# Patient Record
Sex: Female | Born: 1950 | Race: White | Hispanic: No | Smoking: Never smoker
Health system: Southern US, Community
[De-identification: ages and names within clinical notes are randomized; demographics above are authoritative.]

## PROBLEM LIST (undated history)

## (undated) DIAGNOSIS — Z22322 Carrier or suspected carrier of Methicillin resistant Staphylococcus aureus: Secondary | ICD-10-CM

## (undated) DIAGNOSIS — I350 Nonrheumatic aortic (valve) stenosis: Secondary | ICD-10-CM

## (undated) DIAGNOSIS — T63331A Toxic effect of venom of brown recluse spider, accidental (unintentional), initial encounter: Secondary | ICD-10-CM

## (undated) DIAGNOSIS — L97919 Non-pressure chronic ulcer of unspecified part of right lower leg with unspecified severity: Secondary | ICD-10-CM

## (undated) DIAGNOSIS — Z9109 Other allergy status, other than to drugs and biological substances: Secondary | ICD-10-CM

## (undated) DIAGNOSIS — L97929 Non-pressure chronic ulcer of unspecified part of left lower leg with unspecified severity: Secondary | ICD-10-CM

## (undated) DIAGNOSIS — R7303 Prediabetes: Secondary | ICD-10-CM

## (undated) DIAGNOSIS — I519 Heart disease, unspecified: Secondary | ICD-10-CM

## (undated) DIAGNOSIS — D649 Anemia, unspecified: Secondary | ICD-10-CM

## (undated) DIAGNOSIS — M1990 Unspecified osteoarthritis, unspecified site: Secondary | ICD-10-CM

## (undated) DIAGNOSIS — E669 Obesity, unspecified: Secondary | ICD-10-CM

## (undated) DIAGNOSIS — I2721 Secondary pulmonary arterial hypertension: Secondary | ICD-10-CM

---

## 2013-03-25 DIAGNOSIS — L97919 Non-pressure chronic ulcer of unspecified part of right lower leg with unspecified severity: Secondary | ICD-10-CM

## 2013-03-25 DIAGNOSIS — L97929 Non-pressure chronic ulcer of unspecified part of left lower leg with unspecified severity: Secondary | ICD-10-CM

## 2013-03-25 HISTORY — DX: Non-pressure chronic ulcer of unspecified part of right lower leg with unspecified severity: L97.919

## 2013-03-25 HISTORY — DX: Non-pressure chronic ulcer of unspecified part of left lower leg with unspecified severity: L97.929

## 2015-06-24 DIAGNOSIS — D649 Anemia, unspecified: Secondary | ICD-10-CM

## 2015-06-24 HISTORY — DX: Anemia, unspecified: D64.9

## 2015-07-11 ENCOUNTER — Emergency Department (HOSPITAL_COMMUNITY): Payer: Medicaid Other

## 2015-07-11 ENCOUNTER — Inpatient Hospital Stay (HOSPITAL_COMMUNITY)
Admission: EM | Admit: 2015-07-11 | Discharge: 2015-07-24 | DRG: 573 | Disposition: A | Discharge status: Skilled Nursing Facility | Payer: Medicaid Other | Attending: Internal Medicine | Admitting: Internal Medicine

## 2015-07-11 ENCOUNTER — Encounter (HOSPITAL_COMMUNITY): Payer: Self-pay | Admitting: *Deleted

## 2015-07-11 DIAGNOSIS — Z882 Allergy status to sulfonamides status: Secondary | ICD-10-CM | POA: Diagnosis not present

## 2015-07-11 DIAGNOSIS — I350 Nonrheumatic aortic (valve) stenosis: Secondary | ICD-10-CM | POA: Diagnosis present

## 2015-07-11 DIAGNOSIS — R011 Cardiac murmur, unspecified: Secondary | ICD-10-CM | POA: Diagnosis present

## 2015-07-11 DIAGNOSIS — L03116 Cellulitis of left lower limb: Secondary | ICD-10-CM | POA: Diagnosis present

## 2015-07-11 DIAGNOSIS — I272 Other secondary pulmonary hypertension: Secondary | ICD-10-CM | POA: Diagnosis present

## 2015-07-11 DIAGNOSIS — L89159 Pressure ulcer of sacral region, unspecified stage: Secondary | ICD-10-CM | POA: Diagnosis not present

## 2015-07-11 DIAGNOSIS — E46 Unspecified protein-calorie malnutrition: Secondary | ICD-10-CM | POA: Diagnosis present

## 2015-07-11 DIAGNOSIS — D473 Essential (hemorrhagic) thrombocythemia: Secondary | ICD-10-CM | POA: Diagnosis present

## 2015-07-11 DIAGNOSIS — R7303 Prediabetes: Secondary | ICD-10-CM | POA: Diagnosis present

## 2015-07-11 DIAGNOSIS — T39395A Adverse effect of other nonsteroidal anti-inflammatory drugs [NSAID], initial encounter: Secondary | ICD-10-CM | POA: Diagnosis present

## 2015-07-11 DIAGNOSIS — D638 Anemia in other chronic diseases classified elsewhere: Secondary | ICD-10-CM | POA: Diagnosis present

## 2015-07-11 DIAGNOSIS — Z22322 Carrier or suspected carrier of Methicillin resistant Staphylococcus aureus: Secondary | ICD-10-CM | POA: Diagnosis not present

## 2015-07-11 DIAGNOSIS — R739 Hyperglycemia, unspecified: Secondary | ICD-10-CM | POA: Diagnosis present

## 2015-07-11 DIAGNOSIS — D619 Aplastic anemia, unspecified: Secondary | ICD-10-CM | POA: Diagnosis present

## 2015-07-11 DIAGNOSIS — Z6841 Body Mass Index (BMI) 40.0 and over, adult: Secondary | ICD-10-CM

## 2015-07-11 DIAGNOSIS — I872 Venous insufficiency (chronic) (peripheral): Secondary | ICD-10-CM | POA: Diagnosis present

## 2015-07-11 DIAGNOSIS — K922 Gastrointestinal hemorrhage, unspecified: Secondary | ICD-10-CM | POA: Diagnosis present

## 2015-07-11 DIAGNOSIS — E43 Unspecified severe protein-calorie malnutrition: Secondary | ICD-10-CM | POA: Diagnosis present

## 2015-07-11 DIAGNOSIS — I2721 Secondary pulmonary arterial hypertension: Secondary | ICD-10-CM | POA: Diagnosis present

## 2015-07-11 DIAGNOSIS — L97909 Non-pressure chronic ulcer of unspecified part of unspecified lower leg with unspecified severity: Secondary | ICD-10-CM | POA: Diagnosis present

## 2015-07-11 DIAGNOSIS — E876 Hypokalemia: Secondary | ICD-10-CM | POA: Diagnosis present

## 2015-07-11 DIAGNOSIS — L03115 Cellulitis of right lower limb: Secondary | ICD-10-CM | POA: Diagnosis present

## 2015-07-11 DIAGNOSIS — A419 Sepsis, unspecified organism: Secondary | ICD-10-CM | POA: Diagnosis present

## 2015-07-11 DIAGNOSIS — I83009 Varicose veins of unspecified lower extremity with ulcer of unspecified site: Secondary | ICD-10-CM | POA: Diagnosis present

## 2015-07-11 DIAGNOSIS — D509 Iron deficiency anemia, unspecified: Secondary | ICD-10-CM | POA: Diagnosis present

## 2015-07-11 DIAGNOSIS — D62 Acute posthemorrhagic anemia: Secondary | ICD-10-CM | POA: Diagnosis present

## 2015-07-11 DIAGNOSIS — D61811 Other drug-induced pancytopenia: Secondary | ICD-10-CM

## 2015-07-11 DIAGNOSIS — D7589 Other specified diseases of blood and blood-forming organs: Secondary | ICD-10-CM | POA: Diagnosis present

## 2015-07-11 DIAGNOSIS — S81809A Unspecified open wound, unspecified lower leg, initial encounter: Secondary | ICD-10-CM | POA: Diagnosis present

## 2015-07-11 DIAGNOSIS — N179 Acute kidney failure, unspecified: Secondary | ICD-10-CM

## 2015-07-11 DIAGNOSIS — D75839 Thrombocytosis, unspecified: Secondary | ICD-10-CM | POA: Diagnosis present

## 2015-07-11 DIAGNOSIS — I519 Heart disease, unspecified: Secondary | ICD-10-CM | POA: Diagnosis present

## 2015-07-11 DIAGNOSIS — E8809 Other disorders of plasma-protein metabolism, not elsewhere classified: Secondary | ICD-10-CM | POA: Diagnosis present

## 2015-07-11 HISTORY — DX: Other allergy status, other than to drugs and biological substances: Z91.09

## 2015-07-11 HISTORY — DX: Secondary pulmonary arterial hypertension: I27.21

## 2015-07-11 HISTORY — DX: Non-pressure chronic ulcer of unspecified part of right lower leg with unspecified severity: L97.919

## 2015-07-11 HISTORY — DX: Nonrheumatic aortic (valve) stenosis: I35.0

## 2015-07-11 HISTORY — DX: Anemia, unspecified: D64.9

## 2015-07-11 HISTORY — DX: Toxic effect of venom of brown recluse spider, accidental (unintentional), initial encounter: T63.331A

## 2015-07-11 HISTORY — DX: Obesity, unspecified: E66.9

## 2015-07-11 HISTORY — DX: Prediabetes: R73.03

## 2015-07-11 HISTORY — DX: Heart disease, unspecified: I51.9

## 2015-07-11 HISTORY — DX: Non-pressure chronic ulcer of unspecified part of left lower leg with unspecified severity: L97.929

## 2015-07-11 HISTORY — DX: Carrier or suspected carrier of methicillin resistant Staphylococcus aureus: Z22.322

## 2015-07-11 HISTORY — DX: Unspecified osteoarthritis, unspecified site: M19.90

## 2015-07-11 LAB — RETICULOCYTES
RBC.: 2.71 MIL/uL — ABNORMAL LOW (ref 3.87–5.11)
Retic Count, Absolute: 103 10*3/uL (ref 19.0–186.0)
Retic Ct Pct: 3.8 % — ABNORMAL HIGH (ref 0.4–3.1)

## 2015-07-11 LAB — CBC
HCT: 19.6 % — ABNORMAL LOW (ref 36.0–46.0)
HEMATOCRIT: 22 % — AB (ref 36.0–46.0)
HEMOGLOBIN: 6.4 g/dL — AB (ref 12.0–15.0)
Hemoglobin: 6 g/dL — CL (ref 12.0–15.0)
MCH: 21.1 pg — ABNORMAL LOW (ref 26.0–34.0)
MCH: 22.3 pg — AB (ref 26.0–34.0)
MCHC: 29.1 g/dL — AB (ref 30.0–36.0)
MCHC: 30.6 g/dL (ref 30.0–36.0)
MCV: 72.6 fL — ABNORMAL LOW (ref 78.0–100.0)
MCV: 72.9 fL — ABNORMAL LOW (ref 78.0–100.0)
PLATELETS: 416 10*3/uL — AB (ref 150–400)
Platelets: 406 10*3/uL — ABNORMAL HIGH (ref 150–400)
RBC: 3.03 MIL/uL — ABNORMAL LOW (ref 3.87–5.11)
RBC: 2.69 MIL/uL — AB (ref 3.87–5.11)
RDW: 20.5 % — ABNORMAL HIGH (ref 11.5–15.5)
RDW: 20.6 % — ABNORMAL HIGH (ref 11.5–15.5)
WBC: 19.2 10*3/uL — AB (ref 4.0–10.5)
WBC: 15.4 10*3/uL — AB (ref 4.0–10.5)

## 2015-07-11 LAB — BASIC METABOLIC PANEL
ANION GAP: 12 (ref 5–15)
BUN: 23 mg/dL — ABNORMAL HIGH (ref 6–20)
CALCIUM: 9.1 mg/dL (ref 8.9–10.3)
CHLORIDE: 108 mmol/L (ref 101–111)
CO2: 19 mmol/L — ABNORMAL LOW (ref 22–32)
CREATININE: 0.78 mg/dL (ref 0.44–1.00)
GFR calc Af Amer: >60 mL/min (ref >60)
GFR calc non Af Amer: >60 mL/min (ref >60)
Glucose, Bld: 124 mg/dL — ABNORMAL HIGH (ref 65–99)
Potassium: 2.9 mmol/L — ABNORMAL LOW (ref 3.5–5.1)
SODIUM: 139 mmol/L (ref 135–145)

## 2015-07-11 LAB — VITAMIN B12: Vitamin B-12: 7141 pg/mL — ABNORMAL HIGH (ref 180–914)

## 2015-07-11 LAB — ALBUMIN: Albumin: 2.3 g/dL — ABNORMAL LOW (ref 3.5–5.0)

## 2015-07-11 LAB — IRON AND TIBC
Iron: 7 ug/dL — ABNORMAL LOW (ref 28–170)
Saturation Ratios: 3 % — ABNORMAL LOW (ref 10.4–31.8)
TIBC: 246 ug/dL — ABNORMAL LOW (ref 250–450)
UIBC: 239 ug/dL

## 2015-07-11 LAB — CREATININE, SERUM
CREATININE: 0.79 mg/dL (ref 0.44–1.00)
GFR calc non Af Amer: >60 mL/min (ref >60)

## 2015-07-11 LAB — MAGNESIUM: MAGNESIUM: 2.1 mg/dL (ref 1.7–2.4)

## 2015-07-11 LAB — FERRITIN: FERRITIN: 41 ng/mL (ref 11–307)

## 2015-07-11 LAB — I-STAT CG4 LACTIC ACID, ED: Lactic Acid, Venous: 0.85 mmol/L (ref 0.5–2.0)

## 2015-07-11 LAB — FOLATE: Folate: 35 ng/mL (ref >5.9)

## 2015-07-11 LAB — ABO/RH: ABO/RH(D): O NEG

## 2015-07-11 LAB — PREPARE RBC (CROSSMATCH)

## 2015-07-11 MED ORDER — SODIUM CHLORIDE 0.9 % IV SOLN: 10.0000 mL/h | Freq: Once | INTRAVENOUS | Status: DC

## 2015-07-11 MED ORDER — ENOXAPARIN SODIUM 30 MG/0.3ML ~~LOC~~ SOLN: 30.0000 mg | SUBCUTANEOUS | Status: DC

## 2015-07-11 MED ORDER — LORATADINE 10 MG PO TABS
10.0000 mg | ORAL_TABLET | Freq: Every day | ORAL | Status: DC
  Administered 2015-07-12 – 2015-07-24 (×11): 10 mg via ORAL
  Filled 2015-07-11 (×11): qty 1

## 2015-07-11 MED ORDER — SORBITOL 70 % SOLN
30.0000 mL | Freq: Every day | Status: DC | PRN
  Filled 2015-07-11: qty 30

## 2015-07-11 MED ORDER — PIPERACILLIN-TAZOBACTAM 3.375 G IVPB
3.3750 g | Freq: Three times a day (TID) | INTRAVENOUS | Status: DC
  Administered 2015-07-12 – 2015-07-24 (×36): 3.375 g via INTRAVENOUS
  Filled 2015-07-11 (×40): qty 50

## 2015-07-11 MED ORDER — ADULT MULTIVITAMIN W/MINERALS CH
1.0000 | ORAL_TABLET | Freq: Every day | ORAL | Status: DC
  Administered 2015-07-11 – 2015-07-24 (×12): 1 via ORAL
  Filled 2015-07-11 (×14): qty 1

## 2015-07-11 MED ORDER — VANCOMYCIN HCL IN DEXTROSE 1-5 GM/200ML-% IV SOLN
1000.0000 mg | Freq: Once | INTRAVENOUS | Status: AC
  Administered 2015-07-11: 1000 mg via INTRAVENOUS
  Filled 2015-07-11: qty 200

## 2015-07-11 MED ORDER — SODIUM CHLORIDE 0.9 % IV SOLN
INTRAVENOUS | Status: DC
  Administered 2015-07-11 – 2015-07-13 (×4): via INTRAVENOUS
  Filled 2015-07-11 (×7): qty 1000

## 2015-07-11 MED ORDER — POTASSIUM CHLORIDE CRYS ER 20 MEQ PO TBCR
40.0000 meq | EXTENDED_RELEASE_TABLET | Freq: Two times a day (BID) | ORAL | Status: DC
  Administered 2015-07-11 – 2015-07-12 (×3): 40 meq via ORAL
  Filled 2015-07-11 (×3): qty 2

## 2015-07-11 MED ORDER — FERROUS SULFATE 325 (65 FE) MG PO TABS
325.0000 mg | ORAL_TABLET | Freq: Every day | ORAL | Status: DC
  Administered 2015-07-12 – 2015-07-24 (×11): 325 mg via ORAL
  Filled 2015-07-11 (×13): qty 1

## 2015-07-11 MED ORDER — PIPERACILLIN-TAZOBACTAM 3.375 G IVPB 30 MIN
3.3750 g | Freq: Once | INTRAVENOUS | Status: AC
Start: 2015-07-11 — End: 2015-07-11
  Administered 2015-07-11: 3.375 g via INTRAVENOUS
  Filled 2015-07-11: qty 50
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
## 2015-07-11 NOTE — ED Notes (Signed)
JZ notified of critical lab

## 2015-07-11 NOTE — Progress Notes (Signed)
Pharmacy Antibiotic Note  Jane Richard is a 65 y.o. female admitted on 07/11/2015 with wound infection. Presents with chronic, bilateral lower extemity wounds oozing for past week.  Patient receiving Vancomycin 1000 mg IV x 1.  Pharmacy has been consulted for zosyn dosing.  On Admit: Afebrile, HR 88, RR 16, WBC 19.2 LA 0.85, Hgb 6.4, K 2.9, SCr 0.78  Plan: --Zosyn 3.375 g IV q8h (extended infusion) --Follow renal function, clinical course, and cultures   Weight: 300 lb (136.079 kg)  Temp (24hrs), Avg:98.1 F (36.7 C), Min:98.1 F (36.7 C), Max:98.1 F (36.7 C)   Recent Labs Lab 07/11/15 1539 07/11/15 1736  WBC 19.2*  --   CREATININE 0.78  --   LATICACIDVEN  --  0.85    CrCl cannot be calculated (Unknown ideal weight.).    Allergies  Allergen Reactions  . Sulfa Antibiotics Rash    Antimicrobials this admission: 4/18 Vanc >>  4/18 Zosyn >>   Dose adjustments this admission:   Microbiology results: 4/18 BCx:    Thank you for allowing pharmacy to be a part of this patient's care.  Viann Fish 07/11/2015 6:41 PM

## 2015-07-11 NOTE — ED Notes (Signed)
Pt is here for wound check.  Pt was bitten by brown recluse last year and she has not had follow up.  Wound has foul smelling drainage.  No fever, chills or body aches.  Family feels she has been generally weaker and not as well
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
## 2015-07-12 ENCOUNTER — Other Ambulatory Visit (HOSPITAL_COMMUNITY): Payer: Self-pay | Admitting: Family

## 2015-07-12 DIAGNOSIS — R011 Cardiac murmur, unspecified: Secondary | ICD-10-CM

## 2015-07-12 DIAGNOSIS — D75839 Thrombocytosis, unspecified: Secondary | ICD-10-CM | POA: Diagnosis present

## 2015-07-12 DIAGNOSIS — R739 Hyperglycemia, unspecified: Secondary | ICD-10-CM

## 2015-07-12 DIAGNOSIS — L03116 Cellulitis of left lower limb: Secondary | ICD-10-CM | POA: Diagnosis present

## 2015-07-12 DIAGNOSIS — D62 Acute posthemorrhagic anemia: Secondary | ICD-10-CM | POA: Diagnosis present

## 2015-07-12 DIAGNOSIS — E8809 Other disorders of plasma-protein metabolism, not elsewhere classified: Secondary | ICD-10-CM | POA: Diagnosis present

## 2015-07-12 DIAGNOSIS — S81809D Unspecified open wound, unspecified lower leg, subsequent encounter: Secondary | ICD-10-CM

## 2015-07-12 DIAGNOSIS — D473 Essential (hemorrhagic) thrombocythemia: Secondary | ICD-10-CM

## 2015-07-12 DIAGNOSIS — S81809A Unspecified open wound, unspecified lower leg, initial encounter: Secondary | ICD-10-CM | POA: Diagnosis present

## 2015-07-12 DIAGNOSIS — E46 Unspecified protein-calorie malnutrition: Secondary | ICD-10-CM

## 2015-07-12 DIAGNOSIS — D509 Iron deficiency anemia, unspecified: Secondary | ICD-10-CM | POA: Diagnosis present

## 2015-07-12 DIAGNOSIS — K922 Gastrointestinal hemorrhage, unspecified: Secondary | ICD-10-CM | POA: Diagnosis present

## 2015-07-12 LAB — COMPREHENSIVE METABOLIC PANEL
ALK PHOS: 73 U/L (ref 38–126)
ALT: 11 U/L — AB (ref 14–54)
AST: 12 U/L — AB (ref 15–41)
Albumin: 2 g/dL — ABNORMAL LOW (ref 3.5–5.0)
Anion gap: 7 (ref 5–15)
BUN: 14 mg/dL (ref 6–20)
CALCIUM: 8.4 mg/dL — AB (ref 8.9–10.3)
CHLORIDE: 114 mmol/L — AB (ref 101–111)
CO2: 23 mmol/L (ref 22–32)
CREATININE: 0.68 mg/dL (ref 0.44–1.00)
GFR calc non Af Amer: >60 mL/min (ref >60)
Glucose, Bld: 127 mg/dL — ABNORMAL HIGH (ref 65–99)
Potassium: 3.4 mmol/L — ABNORMAL LOW (ref 3.5–5.1)
SODIUM: 144 mmol/L (ref 135–145)
Total Bilirubin: 0.3 mg/dL (ref 0.3–1.2)
Total Protein: 5.6 g/dL — ABNORMAL LOW (ref 6.5–8.1)

## 2015-07-12 LAB — CBC
HEMATOCRIT: 24.6 % — AB (ref 36.0–46.0)
HEMOGLOBIN: 7.8 g/dL — AB (ref 12.0–15.0)
MCH: 24.1 pg — AB (ref 26.0–34.0)
MCHC: 31.7 g/dL (ref 30.0–36.0)
MCV: 75.9 fL — AB (ref 78.0–100.0)
Platelets: 321 10*3/uL (ref 150–400)
RBC: 3.24 MIL/uL — ABNORMAL LOW (ref 3.87–5.11)
RDW: 19.5 % — ABNORMAL HIGH (ref 11.5–15.5)
WBC: 10.3 10*3/uL (ref 4.0–10.5)

## 2015-07-12 LAB — GLUCOSE, CAPILLARY: Glucose-Capillary: 115 mg/dL — ABNORMAL HIGH (ref 65–99)

## 2015-07-12 LAB — OCCULT BLOOD X 1 CARD TO LAB, STOOL: Fecal Occult Bld: POSITIVE — AB

## 2015-07-12 MED ORDER — PRO-STAT SUGAR FREE PO LIQD
30.0000 mL | Freq: Two times a day (BID) | ORAL | Status: DC
  Administered 2015-07-12 – 2015-07-13 (×2): 30 mL via ORAL
  Administered 2015-07-13: 10:00:00 via ORAL
  Administered 2015-07-14 – 2015-07-24 (×19): 30 mL via ORAL
  Filled 2015-07-12 (×22): qty 30

## 2015-07-12 MED ORDER — POLYETHYLENE GLYCOL 3350 17 G PO PACK
17.0000 g | PACK | Freq: Every day | ORAL | Status: DC
  Administered 2015-07-12 – 2015-07-23 (×9): 17 g via ORAL
  Filled 2015-07-12 (×11): qty 1

## 2015-07-12 MED ORDER — HYDROCODONE-ACETAMINOPHEN 5-325 MG PO TABS
1.0000 | ORAL_TABLET | ORAL | Status: DC | PRN
  Administered 2015-07-12 – 2015-07-22 (×15): 1 via ORAL
  Filled 2015-07-12 (×16): qty 1

## 2015-07-12 MED ORDER — VANCOMYCIN HCL IN DEXTROSE 750-5 MG/150ML-% IV SOLN
750.0000 mg | Freq: Two times a day (BID) | INTRAVENOUS | Status: DC
  Administered 2015-07-12 – 2015-07-24 (×25): 750 mg via INTRAVENOUS
  Filled 2015-07-12 (×26): qty 150

## 2015-07-12 NOTE — Consult Note (Addendum)
WOC wound consult note Reason for Consult: Consult requested to apply bilat Profore compression wraps to legs.  Ortho service is following for assessment and plan of care and plans to take patient to the OR on Friday, according to the EMR. Wound type: Bilat legs with full thickness wounds; strong foul odor, large amt yellow drainage, painful to touch. Difficult to obtain accurate measurements related to extensive wounds to anterior and posterior calves.   Left leg 30X15X.4cm, 70% yellow, 20% eschar, 10% red, 2 areas of red raised lesions which are not consistent with full thickness wounds and could benefit from a biopsy. Right leg 15X15X.3cm;  80% yellow, 10% red, 10% eschar Dressing procedure/placement/frequency: Applied xeroform contact layer to minimize adherence of dressings and minimize pain, then ABD pads to absorb drainage, then 4 layer Profore compression wraps. Pt was medicated for pain prior to procedure but it was very painful. These can remain in place until ortho service takes the patient to the OR.  Please refer to their team for further questions.   Please re-consult if further assistance is needed.  Thank-you,  Julien Girt MSN, [place], [place], [place], [place]

## 2015-07-12 NOTE — Progress Notes (Signed)
Initial Nutrition Assessment  DOCUMENTATION CODES:   Morbid obesity  INTERVENTION:   -30 ml Prostat BID, each supplement provides 100 kcals and 15 grams protein -Continue MVI daily  NUTRITION DIAGNOSIS:   Increased nutrient needs related to wound healing as evidenced by estimated needs.  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 Patient reports self care with various soaps and soaks but no fevers and no chills. 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 The patient refused to go to the ED with EMS. Her daughters drove from [place] and insisted that she come to the hospital today.   Pt admitted with bilateral severe leg ulcerations with significant soft tissue loss.   Hx obtained from pt and pt daughter at bedside. Pt reports she "hates doctors" and has not received formal medical care for a number of years. She reports leg wounds have been present for two years and has been managing wounds herself.   She reports she typically consumes 2 meals per day, which consist of either grilled chicken, rice, and broccoli or frozen dinners. Pt reports she has tried to cook more at home with a [employer] grill over the past few years and has been eating less fast food. Pt reports no changes in appetite, but admits she did not eat a lot of breakfast today due to indigestion.   Pt reports UBW of around 278#, however, unable to provide wt hx. Pt admits that she has not been diligent in  keeping up with her weight and medical care. She suspects that she has lost weight due to healthier eating habits. Pt typically ambulates with assistive devices.   Nutrition-Focused physical exam completed. Findings are mild fat depletion, no muscle depletion, and mild edema.   Discussed importance of good nutritional intake to promote intake. Discussed importance of protein-containing foods to assist with wound healing.   Per orthopedics notes, pt will undergo I&D with wound vac placement on 07/14/15.   Reviewed [facility]N note; pt with full thickness wounds to bilateral lower legs.  Case discussed with RN.   Labs reviewed: K: 3.4 (on IV replacement), Glucose: 127, CBGS: 115. Hgb A1c pending.  Diet Order:  Diet regular Room service appropriate?: Yes; Fluid consistency:: Thin  Skin:  Wound (see comment) (bilateral full thickness lower leg wounds)  Last BM:  07/10/15  Height:   Ht Readings from Last 1 Encounters:  07/11/15 5\' 3"  (1.6 m)    Weight:   Wt Readings from Last 1 Encounters:  07/12/15 228 lb (103.42 kg)    Ideal Body Weight:  52.3 kg  BMI:  Body mass index is 40.4 kg/(m^2).  Estimated Nutritional Needs:   Kcal:  1600-1800  Protein:  90-105 grams  Fluid:  1.6-1.8 L  EDUCATION NEEDS:   Education needs addressed  Latoya Maulding A. Jimmye Norman, RD, LDN, CDE Pager: [phone] After hours Pager: [phone]
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
## 2015-07-12 NOTE — Progress Notes (Signed)
Lovenox ordered on admit 4/18 for VTE prophylaxis held for Hb 6. Patient is s/p transfusion.  [place], Pharm.D., BCPS Clinical Pharmacist Pager: [phone] 07/12/2015 9:03 AM

## 2015-07-12 NOTE — Consult Note (Signed)
Jane Ramnauth EdD 

## 2015-07-12 NOTE — Care Management Note (Addendum)
Case Management Note  Patient Details  Name: Curtina Atlas MRN: [id number] Date of Birth: 05/08/1950  Subjective/Objective:                 Spoke with patient and daughter in room. Patient lives at home by herself, she has a cane. Patient moved from [place] 2 years ago and has not established a PCP or transferred her Medicaid. Patient referred to [facility]. Patient received pamphlet from [facility]. CM explained to patient that they may use the on site pharmacy to fill prescriptions given to them at discharge. Patient aware that the [facility] pharmacy will not fill narcotics or pain medications prior to the patient being seen by one of their physicians.  Patient aware that they must be seen as a patient prior to the pharmacy filling the prescriptions a second time.  Patient with circumferential leg wounds and is planned to have SX with Dr Sharol Given on Friday. Receiving IV Abx.    Action/Plan:  Will F/U with [facility]. HH needs probable will reassess after surgery. Financial counselors updated. Since patient had not transferred medicaid and it has been 2 years, medicaid is expired and patient will have to reapply. Financial Counselor will see patient.   Expected Discharge Date:                  Expected Discharge Plan:  [place]  In-House Referral:     Discharge planning Services  CM Consult  Post Acute Care Choice:    Choice offered to:     DME Arranged:    DME Agency:     HH Arranged:    [place] Agency:     Status of Service:  In process, will continue to follow  Medicare Important Message Given:    Date Medicare IM Given:    Medicare IM give by:    Date Additional Medicare IM Given:    Additional Medicare Important Message give by:     If discussed at [place] of Stay Meetings, dates discussed:    Additional Comments:  Carles Collet, RN 07/12/2015, 12:23 PM

## 2015-07-12 NOTE — Progress Notes (Addendum)
Pharmacy Antibiotic Note  Jane Richard is a 65 y.o. female admitted on 07/11/2015 with wound infection. Presents with chronic, bilateral lower extemity wounds oozing for past week. Pharmacy has been consulted for vancomycin and Zosyn dosing. Received vancomycin 1000 mg IV last night.  Plan: Vancomycin 750 mg IV q12h, goal trough 10-15 mcg/ml Continue Zosyn 3.375 g IV q8h to be infused over 4 hours Monitor renal function, clinical progress, culture data   Height: 5\' 3"  (160 cm) Weight: 234 lb (106.142 kg) IBW/kg (Calculated) : 52.4  Temp (24hrs), Avg:98 F (36.7 C), Min:97.8 F (36.6 C), Max:98.2 F (36.8 C)   Recent Labs Lab 07/11/15 1539 07/11/15 1736 07/11/15 2035 07/12/15 0718  WBC 19.2*  --  15.4*  --   CREATININE 0.78  --  0.79 0.68  LATICACIDVEN  --  0.85  --   --     Estimated Creatinine Clearance: 84 mL/min (by C-G formula based on Cr of 0.68).    Allergies  Allergen Reactions  . Sulfa Antibiotics Rash    Antimicrobials this admission: 4/18 Vanc >>  4/18 Zosyn >>   Dose adjustments this admission:   Microbiology results: 4/18 BCx:    Thank you for allowing pharmacy to be a part of this patient's care.  [place], Pharm.D., BCPS Clinical Pharmacist Pager: [phone] 07/12/2015 10:39 AM
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
## 2015-07-12 NOTE — Progress Notes (Signed)
CSW consulted for possible SNF placement.  Per ortho note pt will have surgery Friday with possible wound vac placement.  Pt has no insurance so will need to be evaluated for possible LOG vs private pay placement- will need PT eval completed after surgery.  CSW will assess following surgery.  Domenica Reamer, [place] Social Worker [phone]
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
## 2015-07-13 ENCOUNTER — Inpatient Hospital Stay (HOSPITAL_COMMUNITY): Payer: Medicaid Other

## 2015-07-13 ENCOUNTER — Encounter (HOSPITAL_COMMUNITY): Payer: Self-pay | Admitting: Physician Assistant

## 2015-07-13 DIAGNOSIS — Z6841 Body Mass Index (BMI) 40.0 and over, adult: Secondary | ICD-10-CM

## 2015-07-13 DIAGNOSIS — D509 Iron deficiency anemia, unspecified: Secondary | ICD-10-CM

## 2015-07-13 DIAGNOSIS — R7303 Prediabetes: Secondary | ICD-10-CM

## 2015-07-13 HISTORY — DX: Prediabetes: R73.03

## 2015-07-13 LAB — TYPE AND SCREEN
ABO/RH(D): O NEG
Antibody Screen: NEGATIVE
Unit division: 0
Unit division: 0

## 2015-07-13 LAB — BASIC METABOLIC PANEL
Anion gap: 7 (ref 5–15)
BUN: 12 mg/dL (ref 6–20)
CALCIUM: 8.2 mg/dL — AB (ref 8.9–10.3)
CO2: 21 mmol/L — AB (ref 22–32)
CREATININE: 0.71 mg/dL (ref 0.44–1.00)
Chloride: 114 mmol/L — ABNORMAL HIGH (ref 101–111)
GFR calc non Af Amer: >60 mL/min (ref >60)
Glucose, Bld: 110 mg/dL — ABNORMAL HIGH (ref 65–99)
Potassium: 5.3 mmol/L — ABNORMAL HIGH (ref 3.5–5.1)
Sodium: 142 mmol/L (ref 135–145)

## 2015-07-13 LAB — CBC
HCT: 23.4 % — ABNORMAL LOW (ref 36.0–46.0)
Hemoglobin: 7.1 g/dL — ABNORMAL LOW (ref 12.0–15.0)
MCH: 23.4 pg — AB (ref 26.0–34.0)
MCHC: 30.3 g/dL (ref 30.0–36.0)
MCV: 77 fL — ABNORMAL LOW (ref 78.0–100.0)
PLATELETS: 353 10*3/uL (ref 150–400)
RBC: 3.04 MIL/uL — AB (ref 3.87–5.11)
RDW: 20.2 % — ABNORMAL HIGH (ref 11.5–15.5)
WBC: 7.6 10*3/uL (ref 4.0–10.5)

## 2015-07-13 LAB — ECHOCARDIOGRAM COMPLETE
HEIGHTINCHES: 63 in
WEIGHTICAEL: 3648 [oz_av]

## 2015-07-13 LAB — GLUCOSE, CAPILLARY: GLUCOSE-CAPILLARY: 90 mg/dL (ref 65–99)

## 2015-07-13 LAB — HEMOGLOBIN A1C
Hgb A1c MFr Bld: 6 % — ABNORMAL HIGH (ref 4.8–5.6)
Mean Plasma Glucose: 126 mg/dL

## 2015-07-13 LAB — HIGH SENSITIVITY CRP: CRP, High Sensitivity: 170.16 mg/L — ABNORMAL HIGH (ref 0.00–3.00)

## 2015-07-13 MED ORDER — PANTOPRAZOLE SODIUM 40 MG PO TBEC
40.0000 mg | DELAYED_RELEASE_TABLET | Freq: Every day | ORAL | Status: DC
  Administered 2015-07-13: 40 mg via ORAL
  Filled 2015-07-13: qty 1

## 2015-07-13 MED ORDER — SODIUM CHLORIDE 0.9 % IV SOLN
INTRAVENOUS | Status: DC
  Administered 2015-07-13 – 2015-07-15 (×4): via INTRAVENOUS

## 2015-07-13 MED ORDER — CEFAZOLIN SODIUM-DEXTROSE 2-4 GM/100ML-% IV SOLN
2.0000 g | INTRAVENOUS | Status: AC
  Administered 2015-07-14: 2 g via INTRAVENOUS
  Filled 2015-07-13: qty 100
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
## 2015-07-13 NOTE — Progress Notes (Signed)
  Echocardiogram 2D Echocardiogram has been performed.  Jennette Dubin 07/13/2015, 3:31 PM

## 2015-07-14 ENCOUNTER — Inpatient Hospital Stay (HOSPITAL_COMMUNITY): Payer: Medicaid Other | Admitting: Anesthesiology

## 2015-07-14 ENCOUNTER — Encounter (HOSPITAL_COMMUNITY): Payer: Self-pay | Admitting: Internal Medicine

## 2015-07-14 ENCOUNTER — Encounter (HOSPITAL_COMMUNITY)
Admission: EM | Disposition: A | Discharge status: Skilled Nursing Facility | Payer: Self-pay | Source: Home / Self Care | Attending: Internal Medicine

## 2015-07-14 DIAGNOSIS — I350 Nonrheumatic aortic (valve) stenosis: Secondary | ICD-10-CM

## 2015-07-14 DIAGNOSIS — I2721 Secondary pulmonary arterial hypertension: Secondary | ICD-10-CM

## 2015-07-14 DIAGNOSIS — I519 Heart disease, unspecified: Secondary | ICD-10-CM

## 2015-07-14 DIAGNOSIS — I272 Other secondary pulmonary hypertension: Secondary | ICD-10-CM

## 2015-07-14 DIAGNOSIS — Z22322 Carrier or suspected carrier of Methicillin resistant Staphylococcus aureus: Secondary | ICD-10-CM

## 2015-07-14 HISTORY — PX: I & D EXTREMITY: SHX5045

## 2015-07-14 HISTORY — DX: Carrier or suspected carrier of methicillin resistant Staphylococcus aureus: Z22.322

## 2015-07-14 HISTORY — DX: Nonrheumatic aortic (valve) stenosis: I35.0

## 2015-07-14 HISTORY — DX: Heart disease, unspecified: I51.9

## 2015-07-14 HISTORY — DX: Secondary pulmonary arterial hypertension: I27.21

## 2015-07-14 LAB — BASIC METABOLIC PANEL
Anion gap: 10 (ref 5–15)
BUN: 10 mg/dL (ref 6–20)
CALCIUM: 8.4 mg/dL — AB (ref 8.9–10.3)
CO2: 21 mmol/L — ABNORMAL LOW (ref 22–32)
CREATININE: 0.6 mg/dL (ref 0.44–1.00)
Chloride: 107 mmol/L (ref 101–111)
GFR calc Af Amer: >60 mL/min (ref >60)
Glucose, Bld: 94 mg/dL (ref 65–99)
Potassium: 4.1 mmol/L (ref 3.5–5.1)
SODIUM: 138 mmol/L (ref 135–145)

## 2015-07-14 LAB — GLUCOSE, CAPILLARY
GLUCOSE-CAPILLARY: 74 mg/dL (ref 65–99)
Glucose-Capillary: 86 mg/dL (ref 65–99)
Glucose-Capillary: 98 mg/dL (ref 65–99)

## 2015-07-14 LAB — CBC
HCT: 27.6 % — ABNORMAL LOW (ref 36.0–46.0)
Hemoglobin: 8 g/dL — ABNORMAL LOW (ref 12.0–15.0)
MCH: 22.8 pg — AB (ref 26.0–34.0)
MCHC: 29 g/dL — AB (ref 30.0–36.0)
MCV: 78.6 fL (ref 78.0–100.0)
PLATELETS: 384 10*3/uL (ref 150–400)
RBC: 3.51 MIL/uL — AB (ref 3.87–5.11)
RDW: 20.4 % — AB (ref 11.5–15.5)
WBC: 7.2 10*3/uL (ref 4.0–10.5)

## 2015-07-14 LAB — SURGICAL PCR SCREEN
MRSA, PCR: POSITIVE — AB
STAPHYLOCOCCUS AUREUS: POSITIVE — AB

## 2015-07-14 SURGERY — IRRIGATION AND DEBRIDEMENT EXTREMITY
Anesthesia: General | Laterality: Bilateral

## 2015-07-14 MED ORDER — MUPIROCIN 2 % EX OINT
1.0000 "application " | TOPICAL_OINTMENT | Freq: Two times a day (BID) | CUTANEOUS | Status: AC
  Administered 2015-07-14 – 2015-07-18 (×10): 1 via NASAL
  Filled 2015-07-14 (×2): qty 22

## 2015-07-14 MED ORDER — 0.9 % SODIUM CHLORIDE (POUR BTL) OPTIME
TOPICAL | Status: DC | PRN
  Administered 2015-07-14: 1000 mL

## 2015-07-14 MED ORDER — METHOCARBAMOL 1000 MG/10ML IJ SOLN
500.0000 mg | Freq: Four times a day (QID) | INTRAVENOUS | Status: DC | PRN
  Filled 2015-07-14: qty 5

## 2015-07-14 MED ORDER — OXYCODONE HCL 5 MG PO TABS
5.0000 mg | ORAL_TABLET | ORAL | Status: DC | PRN
  Administered 2015-07-14 – 2015-07-21 (×17): 10 mg via ORAL
  Administered 2015-07-22 (×2): 5 mg via ORAL
  Administered 2015-07-23 (×4): 10 mg via ORAL
  Administered 2015-07-23: 5 mg via ORAL
  Administered 2015-07-24 (×3): 10 mg via ORAL
  Filled 2015-07-14 (×11): qty 2
  Filled 2015-07-14: qty 1
  Filled 2015-07-14 (×8): qty 2
  Filled 2015-07-14: qty 1
  Filled 2015-07-14 (×7): qty 2

## 2015-07-14 MED ORDER — ONDANSETRON HCL 4 MG/2ML IJ SOLN
INTRAMUSCULAR | Status: DC | PRN
  Administered 2015-07-14: 4 mg via INTRAVENOUS

## 2015-07-14 MED ORDER — ONDANSETRON HCL 4 MG/2ML IJ SOLN
4.0000 mg | Freq: Four times a day (QID) | INTRAMUSCULAR | Status: DC | PRN
  Administered 2015-07-21: 4 mg via INTRAVENOUS
  Filled 2015-07-14: qty 2

## 2015-07-14 MED ORDER — LACTATED RINGERS IV SOLN: INTRAVENOUS | Status: DC

## 2015-07-14 MED ORDER — METOCLOPRAMIDE HCL 10 MG PO TABS: 5.0000 mg | ORAL_TABLET | Freq: Three times a day (TID) | ORAL | Status: DC | PRN

## 2015-07-14 MED ORDER — SODIUM CHLORIDE 0.9 % IR SOLN
Status: DC | PRN
  Administered 2015-07-14: 3000 mL

## 2015-07-14 MED ORDER — PHENYLEPHRINE HCL 10 MG/ML IJ SOLN
INTRAMUSCULAR | Status: DC | PRN
  Administered 2015-07-14: 80 ug via INTRAVENOUS

## 2015-07-14 MED ORDER — ROCURONIUM BROMIDE 50 MG/5ML IV SOLN
INTRAVENOUS | Status: AC
  Filled 2015-07-14: qty 1

## 2015-07-14 MED ORDER — ETOMIDATE 2 MG/ML IV SOLN
INTRAVENOUS | Status: DC | PRN
  Administered 2015-07-14: 20 mg via INTRAVENOUS

## 2015-07-14 MED ORDER — HYDROMORPHONE HCL 1 MG/ML IJ SOLN
1.0000 mg | INTRAMUSCULAR | Status: DC | PRN
  Administered 2015-07-14 – 2015-07-23 (×31): 1 mg via INTRAVENOUS
  Filled 2015-07-14 (×31): qty 1

## 2015-07-14 MED ORDER — ONDANSETRON HCL 4 MG PO TABS: 4.0000 mg | ORAL_TABLET | Freq: Four times a day (QID) | ORAL | Status: DC | PRN

## 2015-07-14 MED ORDER — CHLORHEXIDINE GLUCONATE CLOTH 2 % EX PADS
6.0000 | MEDICATED_PAD | Freq: Every day | CUTANEOUS | Status: AC
  Administered 2015-07-14 – 2015-07-18 (×5): 6 via TOPICAL

## 2015-07-14 MED ORDER — ONDANSETRON HCL 4 MG/2ML IJ SOLN: 4.0000 mg | Freq: Once | INTRAMUSCULAR | Status: DC | PRN

## 2015-07-14 MED ORDER — FENTANYL CITRATE (PF) 100 MCG/2ML IJ SOLN
25.0000 ug | INTRAMUSCULAR | Status: DC | PRN
  Administered 2015-07-14: 50 ug via INTRAVENOUS

## 2015-07-14 MED ORDER — FENTANYL CITRATE (PF) 250 MCG/5ML IJ SOLN
INTRAMUSCULAR | Status: AC
  Filled 2015-07-14: qty 5

## 2015-07-14 MED ORDER — SODIUM CHLORIDE 0.9 % IV SOLN
INTRAVENOUS | Status: DC
  Administered 2015-07-15 – 2015-07-16 (×2): via INTRAVENOUS

## 2015-07-14 MED ORDER — PANTOPRAZOLE SODIUM 40 MG IV SOLR
40.0000 mg | Freq: Every day | INTRAVENOUS | Status: DC
  Administered 2015-07-14 – 2015-07-18 (×5): 40 mg via INTRAVENOUS
  Filled 2015-07-14 (×5): qty 40

## 2015-07-14 MED ORDER — PHENYLEPHRINE HCL 10 MG/ML IJ SOLN
10.0000 mg | INTRAVENOUS | Status: DC | PRN
  Administered 2015-07-14: 50 ug/min via INTRAVENOUS

## 2015-07-14 MED ORDER — PROPOFOL 10 MG/ML IV BOLUS
INTRAVENOUS | Status: AC
  Filled 2015-07-14: qty 20

## 2015-07-14 MED ORDER — MIDAZOLAM HCL 5 MG/5ML IJ SOLN
INTRAMUSCULAR | Status: DC | PRN
  Administered 2015-07-14: 2 mg via INTRAVENOUS

## 2015-07-14 MED ORDER — ETOMIDATE 2 MG/ML IV SOLN
INTRAVENOUS | Status: AC
  Filled 2015-07-14: qty 10

## 2015-07-14 MED ORDER — SUCCINYLCHOLINE CHLORIDE 20 MG/ML IJ SOLN
INTRAMUSCULAR | Status: DC | PRN
  Administered 2015-07-14: 120 mg via INTRAVENOUS

## 2015-07-14 MED ORDER — METHOCARBAMOL 500 MG PO TABS
500.0000 mg | ORAL_TABLET | Freq: Four times a day (QID) | ORAL | Status: DC | PRN
  Administered 2015-07-15 – 2015-07-21 (×10): 500 mg via ORAL
  Filled 2015-07-14 (×10): qty 1

## 2015-07-14 MED ORDER — FENTANYL CITRATE (PF) 100 MCG/2ML IJ SOLN
INTRAMUSCULAR | Status: AC
Start: 2015-07-14 — End: 2015-07-15
  Filled 2015-07-14: qty 2

## 2015-07-14 MED ORDER — ACETAMINOPHEN 650 MG RE SUPP: 650.0000 mg | Freq: Four times a day (QID) | RECTAL | Status: DC | PRN

## 2015-07-14 MED ORDER — METOCLOPRAMIDE HCL 5 MG/ML IJ SOLN: 5.0000 mg | Freq: Three times a day (TID) | INTRAMUSCULAR | Status: DC | PRN

## 2015-07-14 MED ORDER — MORPHINE SULFATE (PF) 2 MG/ML IV SOLN
2.0000 mg | INTRAVENOUS | Status: AC | PRN
  Administered 2015-07-14 (×3): 2 mg via INTRAVENOUS
  Filled 2015-07-14 (×3): qty 1

## 2015-07-14 MED ORDER — MIDAZOLAM HCL 2 MG/2ML IJ SOLN
INTRAMUSCULAR | Status: AC
  Filled 2015-07-14: qty 2

## 2015-07-14 MED ORDER — LACTATED RINGERS IV SOLN
INTRAVENOUS | Status: DC
  Administered 2015-07-14 – 2015-07-21 (×2): via INTRAVENOUS

## 2015-07-14 MED ORDER — ACETAMINOPHEN 325 MG PO TABS: 650.0000 mg | ORAL_TABLET | Freq: Four times a day (QID) | ORAL | Status: DC | PRN

## 2015-07-14 MED ORDER — FENTANYL CITRATE (PF) 100 MCG/2ML IJ SOLN
INTRAMUSCULAR | Status: DC | PRN
  Administered 2015-07-14: 100 ug via INTRAVENOUS
  Administered 2015-07-14 (×3): 50 ug via INTRAVENOUS

## 2015-07-14 MED ORDER — LIDOCAINE HCL (CARDIAC) 20 MG/ML IV SOLN
INTRAVENOUS | Status: DC | PRN
  Administered 2015-07-14: 100 mg via INTRAVENOUS

## 2015-07-14 MED ORDER — LIDOCAINE HCL (CARDIAC) 20 MG/ML IV SOLN
INTRAVENOUS | Status: AC
  Filled 2015-07-14: qty 5

## 2015-07-14 MED ORDER — ONDANSETRON HCL 4 MG/2ML IJ SOLN
INTRAMUSCULAR | Status: AC
  Filled 2015-07-14: qty 2

## 2015-07-14 SURGICAL SUPPLY — 43 items
BLADE SURG 10 STRL SS (BLADE) IMPLANT
BLADE SURG 21 STRL SS (BLADE) ×6 IMPLANT
BNDG COHESIVE 4X5 TAN STRL (GAUZE/BANDAGES/DRESSINGS) IMPLANT
BNDG COHESIVE 6X5 TAN STRL LF (GAUZE/BANDAGES/DRESSINGS) IMPLANT
BNDG GAUZE ELAST 4 BULKY (GAUZE/BANDAGES/DRESSINGS) ×6 IMPLANT
CANISTER WOUND CARE 500ML ATS (WOUND CARE) ×6 IMPLANT
CASSETTE VERAFLO VERALINK (MISCELLANEOUS) ×6 IMPLANT
COVER SURGICAL LIGHT HANDLE (MISCELLANEOUS) ×3 IMPLANT
DRAPE INCISE IOBAN 66X45 STRL (DRAPES) ×12 IMPLANT
DRAPE U-SHAPE 47X51 STRL (DRAPES) ×3 IMPLANT
DRSG ADAPTIC 3X8 NADH LF (GAUZE/BANDAGES/DRESSINGS) ×3 IMPLANT
DRSG VERAFLO VAC MED (GAUZE/BANDAGES/DRESSINGS) ×6 IMPLANT
DURAPREP 26ML APPLICATOR (WOUND CARE) ×3 IMPLANT
ELECT CAUTERY BLADE 6.4 (BLADE) IMPLANT
ELECT REM PT RETURN 9FT ADLT (ELECTROSURGICAL) ×3
ELECTRODE REM PT RTRN 9FT ADLT (ELECTROSURGICAL) ×1 IMPLANT
GAUZE SPONGE 4X4 12PLY STRL (GAUZE/BANDAGES/DRESSINGS) ×3 IMPLANT
GLOVE BIOGEL PI IND STRL 9 (GLOVE) ×1 IMPLANT
GLOVE BIOGEL PI INDICATOR 9 (GLOVE) ×2
GLOVE SURG ORTHO 9.0 STRL STRW (GLOVE) ×3 IMPLANT
GOWN STRL REUS W/ TWL LRG LVL3 (GOWN DISPOSABLE) ×2 IMPLANT
GOWN STRL REUS W/ TWL XL LVL3 (GOWN DISPOSABLE) ×1 IMPLANT
GOWN STRL REUS W/TWL LRG LVL3 (GOWN DISPOSABLE) ×4
GOWN STRL REUS W/TWL XL LVL3 (GOWN DISPOSABLE) ×2
HANDPIECE INTERPULSE COAX TIP (DISPOSABLE) ×2
KIT BASIN OR (CUSTOM PROCEDURE TRAY) ×3 IMPLANT
KIT ROOM TURNOVER OR (KITS) ×3 IMPLANT
MANIFOLD NEPTUNE II (INSTRUMENTS) ×3 IMPLANT
NS IRRIG 1000ML POUR BTL (IV SOLUTION) ×3 IMPLANT
PACK ORTHO EXTREMITY (CUSTOM PROCEDURE TRAY) ×3 IMPLANT
PAD ARMBOARD 7.5X6 YLW CONV (MISCELLANEOUS) ×3 IMPLANT
SET HNDPC FAN SPRY TIP SCT (DISPOSABLE) ×1 IMPLANT
SPONGE LAP 18X18 X RAY DECT (DISPOSABLE) ×6 IMPLANT
STOCKINETTE IMPERVIOUS 9X36 MD (GAUZE/BANDAGES/DRESSINGS) ×3 IMPLANT
SWAB COLLECTION DEVICE MRSA (MISCELLANEOUS) IMPLANT
TOWEL OR 17X24 6PK STRL BLUE (TOWEL DISPOSABLE) ×3 IMPLANT
TOWEL OR 17X26 10 PK STRL BLUE (TOWEL DISPOSABLE) ×3 IMPLANT
TUBE ANAEROBIC SPECIMEN COL (MISCELLANEOUS) IMPLANT
TUBE CONNECTING 12'X1/4 (SUCTIONS) ×1
TUBE CONNECTING 12X1/4 (SUCTIONS) ×2 IMPLANT
UNDERPAD 30X30 INCONTINENT (UNDERPADS AND DIAPERS) ×3 IMPLANT
WATER STERILE IRR 1000ML POUR (IV SOLUTION) IMPLANT
YANKAUER SUCT BULB TIP NO VENT (SUCTIONS) ×3 IMPLANT

## 2015-07-14 NOTE — Interval H&P Note (Signed)
History and Physical Interval Note:  07/14/2015 6:50 AM  Anna Genre  has presented today for surgery, with the diagnosis of Venous Stasis Insufficiency Ulcers Bilateral Legs  The various methods of treatment have been discussed with the patient and family. After consideration of risks, benefits and other options for treatment, the patient has consented to  Procedure(s): Debridement Bilateral Legs, Apply Infusional VAC (Bilateral) as a surgical intervention .  The patient's history has been reviewed, patient examined, no change in status, stable for surgery.  I have reviewed the patient's chart and labs.  Questions were answered to the patient's satisfaction.     DUDA,MARCUS V

## 2015-07-14 NOTE — Anesthesia Preprocedure Evaluation (Signed)
Anesthesia Evaluation  Patient identified by MRN, date of birth, ID band Patient awake    Reviewed: Allergy & Precautions, NPO status , Patient's Chart, lab work & pertinent test results  Airway Mallampati: II  TM Distance: >3 FB Neck ROM: Full    Dental  (+) Teeth Intact, Dental Advisory Given   Pulmonary neg pulmonary ROS,    Pulmonary exam normal breath sounds clear to auscultation       Cardiovascular Normal cardiovascular exam+ Valvular Problems/Murmurs AS  Rhythm:Regular Rate:Normal     Neuro/Psych negative neurological ROS     GI/Hepatic Neg liver ROS, History of recent UGIB, untreated   Endo/Other  Morbid obesity  Renal/GU negative Renal ROS     Musculoskeletal  (+) Arthritis , Osteoarthritis,    Abdominal   Peds  Hematology  (+) Blood dyscrasia, anemia ,   Anesthesia Other Findings Day of surgery medications reviewed with the patient.  Reproductive/Obstetrics                             Anesthesia Physical Anesthesia Plan  ASA: III  Anesthesia Plan: General   Post-op Pain Management:    Induction: Intravenous  Airway Management Planned: Oral ETT  Additional Equipment:   Intra-op Plan:   Post-operative Plan: Extubation in OR  Informed Consent: I have reviewed the patients History and Physical, chart, labs and discussed the procedure including the risks, benefits and alternatives for the proposed anesthesia with the patient or authorized representative who has indicated his/her understanding and acceptance.   Dental advisory given  Plan Discussed with: CRNA  Anesthesia Plan Comments: (Risks/benefits of general anesthesia discussed with patient including risk of damage to teeth, lips, gum, and tongue, nausea/vomiting, allergic reactions to medications, and the possibility of heart attack, stroke and death.  All patient questions answered.  Patient wishes to proceed.)         Anesthesia Quick Evaluation

## 2015-07-14 NOTE — Anesthesia Procedure Notes (Signed)
Procedure Name: Intubation Date/Time: 07/14/2015 2:48 PM Performed by: Sampson Si E Pre-anesthesia Checklist: Patient identified, Emergency Drugs available, Suction available, Patient being monitored and Timeout performed Patient Re-evaluated:Patient Re-evaluated prior to inductionOxygen Delivery Method: Circle system utilized Preoxygenation: Pre-oxygenation with 100% oxygen Intubation Type: IV induction Ventilation: Mask ventilation without difficulty Laryngoscope Size: Mac and 3 Grade View: Grade I Tube type: Oral Tube size: 7.0 mm Number of attempts: 1 Airway Equipment and Method: Stylet Placement Confirmation: ETT inserted through vocal cords under direct vision,  positive ETCO2 and breath sounds checked- equal and bilateral Secured at: 22 cm Tube secured with: Tape Dental Injury: Teeth and Oropharynx as per pre-operative assessment

## 2015-07-14 NOTE — Op Note (Signed)
07/11/2015 - 07/14/2015  3:25 PM  PATIENT:  Jane Richard    PRE-OPERATIVE DIAGNOSIS:  Venous Stasis Insufficiency Ulcers Bilateral Legs  POST-OPERATIVE DIAGNOSIS:  Same  PROCEDURE:  Debridement Bilateral Legs, Apply Infusional VAC 2  SURGEON:  DUDA,MARCUS V, MD  PHYSICIAN ASSISTANT:None ANESTHESIA:   General  PREOPERATIVE INDICATIONS:  Jane Richard is a  65 y.o. female with a diagnosis of Venous Stasis Insufficiency Ulcers Bilateral Legs who failed conservative measures and elected for surgical management.    The risks benefits and alternatives were discussed with the patient preoperatively including but not limited to the risks of infection, bleeding, nerve injury, cardiopulmonary complications, the need for revision surgery, among others, and the patient was willing to proceed.  OPERATIVE IMPLANTS: Infusional VAC 2  OPERATIVE FINDINGS: Massive necrotic ulceration involving both legs from the knee to the ankle circumferential with foul-smelling odor  OPERATIVE PROCEDURE: Patient was brought to the operating room and underwent a general anesthetic. After adequate levels anesthesia obtained patient's both lower extremities were prepped using DuraPrep draped into a sterile field. A timeout was called. A 21 blade knife was used to debride the circumferential wounds on both legs which were 30 cm in length and essentially circumferential to both legs approximately 40 cm in circumference. This was cut back to bleeding viable tissue all the necrotic tissue was excised all the abscess tissue was excised. A wound VAC sponge was then applied to both legs a total of 4 wound VAC sponges this was then covered with Ioban and this was set to infusional wound VAC which was set for 120 mL on the left leg 100 mm on the right leg with 10 minutes of 12 time 2 hours of suction with 100 mm of suction. A shadow good suction fit she was extubated taken to the PACU in stable condition.
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
## 2015-07-14 NOTE — Anesthesia Postprocedure Evaluation (Signed)
Anesthesia Post Note  Patient: Jane Richard  Procedure(s) Performed: Procedure(s) (LRB): Debridement Bilateral Legs, Apply Infusional VAC (Bilateral)  Patient location during evaluation: PACU Anesthesia Type: General Level of consciousness: awake and alert Pain management: pain level controlled Vital Signs Assessment: post-procedure vital signs reviewed and stable Respiratory status: spontaneous breathing, nonlabored ventilation, respiratory function stable and patient connected to nasal cannula oxygen Cardiovascular status: blood pressure returned to baseline and stable Postop Assessment: no signs of nausea or vomiting Anesthetic complications: no    Last Vitals:  Filed Vitals:   07/14/15 1534 07/14/15 1536  BP: 96/35 116/63  Pulse: 99 93  Temp:  36.1 C  Resp: 16 13    Last Pain:  Filed Vitals:   07/14/15 1541  PainSc: Norton Edward Railynn Ballo

## 2015-07-14 NOTE — Progress Notes (Signed)
    Tolerated LE debridement We will f/u 1-2 d re: scheduling endoscopic w/u of iron deficiency anemia.  Gatha Mayer, MD, [facility] [phone] (pager) [phone] after 5 PM, weekends and holidays  07/14/2015 6:28 PM

## 2015-07-14 NOTE — Transfer of Care (Signed)
Immediate Anesthesia Transfer of Care Note  Patient: Jane Richard  Procedure(s) Performed: Procedure(s): Debridement Bilateral Legs, Apply Infusional VAC (Bilateral)  Patient Location: PACU  Anesthesia Type:General  Level of Consciousness: lethargic and responds to stimulation  Airway & Oxygen Therapy: Patient Spontanous Breathing and Patient connected to nasal cannula oxygen  Post-op Assessment: Report given to RN  Post vital signs: Reviewed and stable  Last Vitals:  Filed Vitals:   07/13/15 2126 07/14/15 0502  BP: 107/52 149/90  Pulse: 85 94  Temp: 36.7 C 36.6 C  Resp: 18 20    Complications: No apparent anesthesia complications

## 2015-07-14 NOTE — Progress Notes (Signed)
PCR MRSA surgical screening was done this am Result came back positive. Patient was put on contact  ,and education for contact Isolation with handout given to patient and daughter.  .Remain npo for wound debridement,. Pre op education given.
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
## 2015-07-15 LAB — CBC
HEMATOCRIT: 25.3 % — AB (ref 36.0–46.0)
HEMOGLOBIN: 7.6 g/dL — AB (ref 12.0–15.0)
MCH: 23.9 pg — ABNORMAL LOW (ref 26.0–34.0)
MCHC: 30 g/dL (ref 30.0–36.0)
MCV: 79.6 fL (ref 78.0–100.0)
Platelets: 338 10*3/uL (ref 150–400)
RBC: 3.18 MIL/uL — ABNORMAL LOW (ref 3.87–5.11)
RDW: 20.4 % — ABNORMAL HIGH (ref 11.5–15.5)
WBC: 5.9 10*3/uL (ref 4.0–10.5)

## 2015-07-15 LAB — BASIC METABOLIC PANEL
ANION GAP: 10 (ref 5–15)
BUN: 7 mg/dL (ref 6–20)
CHLORIDE: 106 mmol/L (ref 101–111)
CO2: 21 mmol/L — ABNORMAL LOW (ref 22–32)
Calcium: 8.2 mg/dL — ABNORMAL LOW (ref 8.9–10.3)
Creatinine, Ser: 0.64 mg/dL (ref 0.44–1.00)
GFR calc Af Amer: >60 mL/min (ref >60)
GLUCOSE: 92 mg/dL (ref 65–99)
POTASSIUM: 3.8 mmol/L (ref 3.5–5.1)
Sodium: 137 mmol/L (ref 135–145)

## 2015-07-15 LAB — GLUCOSE, CAPILLARY: Glucose-Capillary: 88 mg/dL (ref 65–99)

## 2015-07-15 LAB — VANCOMYCIN, TROUGH: Vancomycin Tr: 13 ug/mL (ref 10.0–20.0)

## 2015-07-15 NOTE — Progress Notes (Signed)
Pharmacy Antibiotic Note  Jane Richard is a 65 y.o. female admitted on 07/11/2015 with wound infection. Presents with chronic, bilateral lower extemity wounds oozing for past week. Pharmacy has been consulted for vancomycin and Zosyn dosing.  Plan: -Vancomycin 750 mg IV q12h, goal trough 10-15 mcg/ml -Zosyn 3.375 g IV q8h to be infused over 4 hours -Monitor renal function, clinical progress, culture data, LOT   Height: 5\' 3"  (160 cm) Weight: 228 lb (103.42 kg) IBW/kg (Calculated) : 52.4  Temp (24hrs), Avg:98.2 F (36.8 C), Min:97.8 F (36.6 C), Max:98.8 F (37.1 C)   Recent Labs Lab 07/11/15 1736 07/11/15 2035 07/12/15 0718 07/12/15 1124 07/13/15 0518 07/14/15 0523 07/15/15 0539 07/15/15 2140  WBC  --  15.4*  --  10.3 7.6 7.2 5.9  --   CREATININE  --  0.79 0.68  --  0.71 0.60 0.64  --   LATICACIDVEN 0.85  --   --   --   --   --   --   --   VANCOTROUGH  --   --   --   --   --   --   --  13    Estimated Creatinine Clearance: 82.7 mL/min (by C-G formula based on Cr of 0.64).    Allergies  Allergen Reactions  . Sulfa Antibiotics Rash    Antimicrobials this admission: Vanc 4/18 >>  Zosyn 4/18 >>   Dose adjustments this admission: 4/22 VT = 30mcg/mL on 750mg  IV q12h- continued  Microbiology results: 4/18 BCx: ngtd 4/21 MRSA surgical screen: pos  Thank you for allowing pharmacy to be a part of this patient's care.  Jane Richard D. Jane Richard, PharmD, BCPS Clinical Pharmacist Pager: [phone] 07/15/2015 10:29 PM

## 2015-07-15 NOTE — Progress Notes (Signed)
Patient stable Wound vacs intact bilateral lower chemise Feet perfused Irrigating wound vacs will likely need to be on for several more days Dr. Sharol Given to reevaluate on Monday
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
## 2015-07-16 LAB — CBC
HCT: 24.7 % — ABNORMAL LOW (ref 36.0–46.0)
HEMOGLOBIN: 7.2 g/dL — AB (ref 12.0–15.0)
MCH: 23 pg — AB (ref 26.0–34.0)
MCHC: 29.1 g/dL — AB (ref 30.0–36.0)
MCV: 78.9 fL (ref 78.0–100.0)
PLATELETS: 348 10*3/uL (ref 150–400)
RBC: 3.13 MIL/uL — AB (ref 3.87–5.11)
RDW: 20.4 % — ABNORMAL HIGH (ref 11.5–15.5)
WBC: 5.6 10*3/uL (ref 4.0–10.5)

## 2015-07-16 LAB — GLUCOSE, CAPILLARY: GLUCOSE-CAPILLARY: 99 mg/dL (ref 65–99)

## 2015-07-16 LAB — BASIC METABOLIC PANEL
Anion gap: 8 (ref 5–15)
BUN: 7 mg/dL (ref 6–20)
CALCIUM: 8.4 mg/dL — AB (ref 8.9–10.3)
CHLORIDE: 106 mmol/L (ref 101–111)
CO2: 24 mmol/L (ref 22–32)
CREATININE: 0.58 mg/dL (ref 0.44–1.00)
Glucose, Bld: 105 mg/dL — ABNORMAL HIGH (ref 65–99)
Potassium: 3.9 mmol/L (ref 3.5–5.1)
SODIUM: 138 mmol/L (ref 135–145)

## 2015-07-16 LAB — CULTURE, BLOOD (ROUTINE X 2)
CULTURE: NO GROWTH
Culture: NO GROWTH

## 2015-07-16 MED ORDER — HYDROCORTISONE 1 % EX CREA
TOPICAL_CREAM | Freq: Three times a day (TID) | CUTANEOUS | Status: DC
  Administered 2015-07-16 – 2015-07-18 (×6): via TOPICAL
  Administered 2015-07-18: 1 via TOPICAL
  Administered 2015-07-18: 16:00:00 via TOPICAL
  Administered 2015-07-19: 1 via TOPICAL
  Administered 2015-07-20 – 2015-07-22 (×5): via TOPICAL
  Administered 2015-07-22: 1 via TOPICAL
  Administered 2015-07-22 – 2015-07-24 (×5): via TOPICAL
  Filled 2015-07-16: qty 28

## 2015-07-16 NOTE — Progress Notes (Addendum)
   Has wound vacs on both legs. She cannot prep for a colonoscopy with these. Will either have to be later in stay vs outpatient. EGD/colon to eval iron deficiency anemia  Call us back about this as she progresses.  Gatha Mayer, MD, [facility] [phone] (pager) [phone] after 5 PM, weekends and holidays  07/16/2015 11:24 AM

## 2015-07-16 NOTE — Progress Notes (Signed)
Patient has had a rash develop on the right ac area it is red in color and itchy for the patient.  Asked MD for some cream

## 2015-07-16 NOTE — Progress Notes (Addendum)
Left side wound vac is leaking NS solution during instillation only.  RN made aware.  Applied extra Tegaderm to the area to stop leaking.  Called [facility] surgical to help assess for suggestions to help fix the leak   Paged on call orthopedics MD for further instructions
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
## 2015-07-16 NOTE — Progress Notes (Addendum)
Orthopedic Secretary called about to confirm patinet was one of their patients.  Patient's I&D performed by Dr Sharol Given of [facility]

## 2015-07-16 NOTE — Progress Notes (Signed)
Air mattress overlay ordered for patient

## 2015-07-16 NOTE — Progress Notes (Signed)
Would vac leak was fixed by taping the tubing up the patients leg to allow the fluid to flow into the canister

## 2015-07-17 ENCOUNTER — Encounter (HOSPITAL_COMMUNITY): Payer: Self-pay | Admitting: Orthopedic Surgery

## 2015-07-17 LAB — HEMOGLOBIN AND HEMATOCRIT, BLOOD
HEMATOCRIT: 26.2 % — AB (ref 36.0–46.0)
HEMOGLOBIN: 7.7 g/dL — AB (ref 12.0–15.0)

## 2015-07-17 LAB — GLUCOSE, CAPILLARY: GLUCOSE-CAPILLARY: 92 mg/dL (ref 65–99)

## 2015-07-17 NOTE — NC FL2 (Signed)
[place] LEVEL OF CARE SCREENING TOOL     IDENTIFICATION  Patient Name: Jane Richard Birthdate: 1951-02-10 Sex: female Admission Date (Current Location): 07/11/2015  [facility] and [place] Number:  Herbalist and Address:  The . [facility], [place] [address], [place], [place] [address]      Provider Number: [id number]  Attending Physician Name and Address:  Venetia Maxon Rama, MD  Relative Name and Phone Number:       Current Level of Care: Hospital Recommended Level of Care: [place] Prior Approval Number:    Date Approved/Denied:   PASRR Number: [id number]  Discharge Plan: SNF    Current Diagnoses: Patient Active Problem List   Diagnosis Date Noted  . 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 Aortic stenosis 07/14/2015  . Mild diastolic dysfunction [id number]  . Pulmonary artery hypertension ([place]) 07/14/2015  . Morbid obesity with BMI of 40.0-44.9, adult ([place]) 07/13/2015  . Prediabetes 07/13/2015  . Hyperglycemia 07/12/2015  . Hypoalbuminemia due to protein-calorie malnutrition ([place]) 07/12/2015  . Microcytic anemia 07/12/2015  . Thrombocytosis ([place]) 07/12/2015  . Acute blood loss anemia 07/12/2015  . Anemia, iron deficiency 07/12/2015  . Cellulitis of leg, bilateral 07/12/2015  . Wounds, multiple open, bilateral lower extremity 07/12/2015  . Heart murmur 07/12/2015  . Anemia aplastic aregenerative ([place]) 07/11/2015  . Hypokalemia 07/11/2015    Orientation RESPIRATION BLADDER Height & Weight     Self, Time, Situation, Place  Normal Continent Weight: 228 lb (103.42 kg) Height:  5\' 3"  (160 cm)  BEHAVIORAL SYMPTOMS/MOOD NEUROLOGICAL BOWEL NUTRITION STATUS      Continent Diet (regular)  AMBULATORY STATUS COMMUNICATION OF NEEDS Skin   Extensive Assist Verbally Wound Vac (2 wound vacs on bilateral leg wounds)                       Personal Care Assistance Level of Assistance  Bathing, Dressing Bathing Assistance:  Maximum assistance   Dressing Assistance: Maximum assistance     Functional Limitations Info             SPECIAL CARE FACTORS FREQUENCY  PT (By licensed PT), OT (By licensed OT)     PT Frequency: 5/wk OT Frequency: 5/wk            Contractures      Additional Factors Info  Code Status, Allergies, Isolation Precautions (continued IV antibiotics (vanc and zosyn)) Code Status Info: FULL Allergies Info: Sulfa Antibiotics     Isolation Precautions Info: MRSA     Current Medications (07/17/2015):  This is the current hospital active medication list Current Facility-Administered Medications  Medication Dose Route Frequency Provider Last Rate Last Dose  . 0.9 %  sodium chloride infusion  10 mL/hr Intravenous Once Carmin Muskrat, MD      . 0.9 %  sodium chloride infusion   Intravenous Continuous Newt Minion, MD 10 mL/hr at 07/16/15 0905    . acetaminophen (TYLENOL) tablet 650 mg  650 mg Oral Q6H PRN Newt Minion, MD       Or  . acetaminophen (TYLENOL) suppository 650 mg  650 mg Rectal Q6H PRN Newt Minion, MD      . Chlorhexidine Gluconate Cloth 2 % PADS 6 each  6 each Topical Q0600 Venetia Maxon Rama, MD   6 each at 07/17/15 0600  . feeding supplement (PRO-STAT SUGAR [place] 64) liquid 30 mL  30 mL Oral BID Venetia Maxon Rama, MD   30 mL  at 07/17/15 0851  . ferrous sulfate tablet 325 mg  325 mg Oral Q breakfast Nita Sells, MD   325 mg at 07/17/15 0908  . HYDROcodone-acetaminophen (NORCO/VICODIN) 5-325 MG per tablet 1 tablet  1 tablet Oral Q4H PRN Venetia Maxon Rama, MD   1 tablet at 07/16/15 1439  . hydrocortisone cream 1 %   Topical TID Venetia Maxon Rama, MD      . HYDROmorphone (DILAUDID) injection 1 mg  1 mg Intravenous Q2H PRN Newt Minion, MD   1 mg at 07/17/15 0459  . lactated ringers infusion   Intravenous Continuous Catalina Gravel, MD 10 mL/hr at 07/14/15 1429    . loratadine (CLARITIN) tablet 10 mg  10 mg Oral Daily Nita Sells, MD   10 mg at 07/17/15  0851  . methocarbamol (ROBAXIN) tablet 500 mg  500 mg Oral Q6H PRN Newt Minion, MD   500 mg at 07/17/15 0013   Or  . methocarbamol (ROBAXIN) 500 mg in dextrose 5 % 50 mL IVPB  500 mg Intravenous Q6H PRN Newt Minion, MD      . metoCLOPramide (REGLAN) tablet 5-10 mg  5-10 mg Oral Q8H PRN Newt Minion, MD       Or  . metoCLOPramide (REGLAN) injection 5-10 mg  5-10 mg Intravenous Q8H PRN Newt Minion, MD      . multivitamin with minerals tablet 1 tablet  1 tablet Oral Daily Nita Sells, MD   1 tablet at 07/17/15 0850  . mupirocin ointment (BACTROBAN) 2 % 1 application  1 application Nasal BID Venetia Maxon Rama, MD   1 application at 0000000 0914  . ondansetron (ZOFRAN) tablet 4 mg  4 mg Oral Q6H PRN Newt Minion, MD       Or  . ondansetron [facility]) injection 4 mg  4 mg Intravenous Q6H PRN Meridee Score V, MD      . oxyCODONE (Oxy IR/ROXICODONE) immediate release tablet 5-10 mg  5-10 mg Oral Q3H PRN Newt Minion, MD   10 mg at 07/17/15 0851  . pantoprazole (PROTONIX) injection 40 mg  40 mg Intravenous Daily Rhetta Mura Schorr, NP   40 mg at 07/17/15 0851  . piperacillin-tazobactam (ZOSYN) IVPB 3.375 g  3.375 g Intravenous Q8H Rebecka Apley, RPH   3.375 g at 07/17/15 0912  . polyethylene glycol (MIRALAX / GLYCOLAX) packet 17 g  17 g Oral Daily Venetia Maxon Rama, MD   17 g at 07/17/15 0851  . sorbitol 70 % solution 30 mL  30 mL Oral Daily PRN Nita Sells, MD      . vancomycin (VANCOCIN) IVPB 750 mg/150 ml premix  750 mg Intravenous Q12H Donalynn Furlong [place], RPH   750 mg at 07/17/15 [id number]     Discharge Medications: Please see discharge summary for a list of discharge medications.  Relevant Imaging Results:  Relevant Lab Results:   Additional Information SS#: [id number]  Cranford Mon, [place]

## 2015-07-17 NOTE — Clinical Social Work Note (Signed)
Clinical Social Work Assessment  Patient Details  Name: Jane Richard MRN: [id number] Date of Birth: April 21, 1950  Date of referral:  07/17/15               Reason for consult:  Facility Placement                Permission sought to share information with:  Facility Sport and exercise psychologist, Family Supports Permission granted to share information::  Yes, Verbal Permission Granted  Name::     Jane Richard  Agency::  [facility]  Relationship::  daughters  Contact Information:     Housing/Transportation Living arrangements for the past 2 months:  [place] of Information:  Patient, Adult Children Patient Interpreter Needed:  None Criminal Activity/Legal Involvement Pertinent to Current Situation/Hospitalization:  No - Comment as needed Significant Relationships:  Adult Children Lives with:  Self Do you feel safe going back to the place where you live?  No Need for family participation in patient care:  No (Coment)  Care giving concerns:  Pt lives at home alone and does not have anyone who lives locally who can help her.  Pt two dtrs live in [place] and are unable to be here for pt following hospitalization.  Dtrs have concerns with pt ability to make proper decisions for herself which they believe has led to pts current state    Facilities manager / plan:  CSW spoke with pt dtr Rom[place] and pt at bedside.  Pt dtr stated that they wanted to talk to [place] about HCPOA paperwork.  CSW provided pt dtr with paperwork and explained the Advanced Directive paperwork and how to ask for notary to finalize paperwork- pt has capacity to make decisions but wants her daughters to be responsible for all of her medical decisions at this time.  CSW also spoke with pt and dtr concerning recommendation for SNF.  CSW discussed possible transfer to SNF while awaiting next surgery and also the likely need for short term SNF stay following next surgery to work on therapy and help with wound  care while pt recovers.  Employment status:  Unemployed Forensic scientist:  Self Pay (Medicaid Pending) PT Recommendations:  [place] / Referral to community resources:  [place]  Patient/Family's Response to care:  Pt did not say much during assessment and defers to dtr but did say she was agreeable to plan for SNF and understands LOG process.  Pt dtr is also agreeable and grateful that there will be an option for the pt.  Patient/Family's Understanding of and Emotional Response to Diagnosis, Current Treatment, and Prognosis:  Pt does not seem to have good understanding of current care and defers all explanations and decisions to her daughters- pt dtr does have a good understanding of pt condition and is very worried about long term care needs of the pt.  Dtr is determined to get pt to [place] to be near family as soon as possible so they can support her and prevent something like this from happening again.  Emotional Assessment Appearance:  Appears stated age Attitude/Demeanor/Rapport:    Affect (typically observed):  Appropriate, Pleasant, Quiet Orientation:    Alcohol / Substance use:  Not Applicable Psych involvement (Current and /or in the community):  No (Comment)  Discharge Needs  Concerns to be addressed:  Discharge Planning Concerns, Care Coordination Readmission within the last 30 days:  No Current discharge risk:  Physical Impairment Barriers to Discharge:  Continued Medical Work  up, Inadequate or no insurance   [place], [place], [place] 07/17/2015, 2:44 PM
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
## 2015-07-17 NOTE — Progress Notes (Signed)
Patient ID: Jane Richard, female   DOB: Feb 20, 1951, 65 y.o.   MRN: [id number] Instillation wound VAC is functioning well at this time. Plan for return to surgery this week for application of split-thickness skin grafts as well as placement of wound vacs without installation.Plan for surgery Thursday or Friday.

## 2015-07-17 NOTE — Progress Notes (Signed)
Nutrition Follow-up  DOCUMENTATION CODES:   Morbid obesity  INTERVENTION:   -Continue 30 ml Prostat BID, each supplement provides 100 kcals and 15 grams protein -Continue MVI daily  NUTRITION DIAGNOSIS:   Increased nutrient needs related to wound healing as evidenced by estimated needs.  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 Patient reports self care with various soaps and soaks but no fevers and no chills. 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 The patient refused to go to the ED with EMS. Her daughters drove from [place] and insisted that she come to the hospital today.   S/p Procedure(s) 07/14/15: Debridement Bilateral Legs, Apply Infusional VAC (Bilateral)  Pt sleeping soundly at time of visit. RD did not wake.   Pt with wound vacs to bilateral legs.   Meal completion 50-100%. Per MAR, pt is accepting Prostat and MVI supplements. RD will continue to help facilitate wound healing.   CSW following for SNF placement.   Labs reviewed.   Diet Order:  Diet regular Room service appropriate?: Yes; Fluid consistency:: Thin  Skin:  Wound (see comment) (wound vacs to bilateral lower legs)  Last BM:  07/14/15  Height:   Ht Readings from Last 1 Encounters:  07/11/15 5\' 3"  (1.6 m)    Weight:   Wt Readings from Last 1 Encounters:  07/12/15 228 lb (103.42 kg)    Ideal Body Weight:  52.3 kg  BMI:  Body mass index is 40.4  kg/(m^2).  Estimated Nutritional Needs:   Kcal:  1600-1800  Protein:  >105 grams  Fluid:  >1.6 L  EDUCATION NEEDS:   Education needs addressed  Nayleah Gamel A. Jimmye Norman, RD, LDN, CDE Pager: [phone] After hours Pager: [phone]

## 2015-07-18 ENCOUNTER — Other Ambulatory Visit (HOSPITAL_COMMUNITY): Payer: Self-pay | Admitting: Family

## 2015-07-18 LAB — GLUCOSE, CAPILLARY: GLUCOSE-CAPILLARY: 106 mg/dL — AB (ref 65–99)

## 2015-07-18 MED ORDER — PANTOPRAZOLE SODIUM 40 MG PO TBEC
40.0000 mg | DELAYED_RELEASE_TABLET | Freq: Every day | ORAL | Status: DC
  Administered 2015-07-19 – 2015-07-24 (×5): 40 mg via ORAL
  Filled 2015-07-18 (×5): qty 1

## 2015-07-18 NOTE — Progress Notes (Signed)
Pharmacy Antibiotic Note  Jane Richard is a 65 y.o. female admitted on 07/11/2015 with cellulitis.  Pharmacy has been consulted for Vancomycin and Zosyn dosing.  Plan: 64yo with severe bilateral venous stasis ulcers on lower extremities.  She is s/p I&D with wound VAC & significant output.  Plan is for OR on 4/28.  - Continue Vancomcyin 750mg  IV q12 - Continue Zosyn 3.375g IV q8 - F/U plans for abx post-op  Height: 5\' 3"  (160 cm) Weight: 228 lb (103.42 kg) IBW/kg (Calculated) : 52.4  Temp (24hrs), Avg:98.4 F (36.9 C), Min:98.2 F (36.8 C), Max:98.5 F (36.9 C)   Recent Labs Lab 07/11/15 1736  07/12/15 0718 07/12/15 1124 07/13/15 0518 07/14/15 0523 07/15/15 0539 07/15/15 2140 07/16/15 0657  WBC  --   < >  --  10.3 7.6 7.2 5.9  --  5.6  CREATININE  --   < > 0.68  --  0.71 0.60 0.64  --  0.58  LATICACIDVEN 0.85  --   --   --   --   --   --   --   --   VANCOTROUGH  --   --   --   --   --   --   --  13  --   < > = values in this interval not displayed.  Estimated Creatinine Clearance: 82.7 mL/min (by C-G formula based on Cr of 0.58).    Allergies  Allergen Reactions  . Sulfa Antibiotics Rash    Antimicrobials this admission: Vanc 4/18>>  Zosyn 4/18>>   Dose adjustments this admission: 4/22 VT = 13 on 750 q12  Microbiology results: 4/18 BCx: NF  4/21 MRSA surgical screen: pos  Thank you for allowing pharmacy to be a part of this patient's care.  Gracy Bruins, PharmD Clinical Pharmacist [facility]

## 2015-07-18 NOTE — Progress Notes (Signed)
Patient ID: Jane Richard, female   DOB: 1950/10/29, 65 y.o.   MRN: [id number] Patient is scheduled for surgery Friday afternoon to apply split thickness skin graft and apply portable wound VAC.  May discharge Saturday.
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
## 2015-07-19 ENCOUNTER — Other Ambulatory Visit: Payer: Self-pay | Admitting: Orthopedic Surgery

## 2015-07-19 DIAGNOSIS — L0291 Cutaneous abscess, unspecified: Secondary | ICD-10-CM

## 2015-07-19 LAB — CBC
HCT: 26.4 % — ABNORMAL LOW (ref 36.0–46.0)
Hemoglobin: 7.8 g/dL — ABNORMAL LOW (ref 12.0–15.0)
MCH: 23.4 pg — ABNORMAL LOW (ref 26.0–34.0)
MCHC: 29.5 g/dL — ABNORMAL LOW (ref 30.0–36.0)
MCV: 79 fL (ref 78.0–100.0)
PLATELETS: 318 10*3/uL (ref 150–400)
RBC: 3.34 MIL/uL — ABNORMAL LOW (ref 3.87–5.11)
RDW: 21 % — AB (ref 11.5–15.5)
WBC: 7.7 10*3/uL (ref 4.0–10.5)

## 2015-07-19 LAB — BASIC METABOLIC PANEL
ANION GAP: 9 (ref 5–15)
BUN: 10 mg/dL (ref 6–20)
CO2: 28 mmol/L (ref 22–32)
CREATININE: 0.62 mg/dL (ref 0.44–1.00)
Calcium: 8.7 mg/dL — ABNORMAL LOW (ref 8.9–10.3)
Chloride: 101 mmol/L (ref 101–111)
GLUCOSE: 111 mg/dL — AB (ref 65–99)
Potassium: 3.4 mmol/L — ABNORMAL LOW (ref 3.5–5.1)
Sodium: 138 mmol/L (ref 135–145)

## 2015-07-19 LAB — GLUCOSE, CAPILLARY: Glucose-Capillary: 110 mg/dL — ABNORMAL HIGH (ref 65–99)

## 2015-07-19 MED ORDER — POTASSIUM CHLORIDE CRYS ER 20 MEQ PO TBCR
40.0000 meq | EXTENDED_RELEASE_TABLET | Freq: Once | ORAL | Status: AC
Start: 2015-07-19 — End: 2015-07-19
  Administered 2015-07-19: 40 meq via ORAL
  Filled 2015-07-19: qty 2

## 2015-07-19 NOTE — Consult Note (Signed)
Jane Richard 
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
## 2015-07-20 DIAGNOSIS — D62 Acute posthemorrhagic anemia: Secondary | ICD-10-CM

## 2015-07-20 DIAGNOSIS — L03116 Cellulitis of left lower limb: Principal | ICD-10-CM

## 2015-07-20 LAB — CBC
HCT: 27.3 % — ABNORMAL LOW (ref 36.0–46.0)
HEMOGLOBIN: 8 g/dL — AB (ref 12.0–15.0)
MCH: 23.3 pg — AB (ref 26.0–34.0)
MCHC: 29.3 g/dL — ABNORMAL LOW (ref 30.0–36.0)
MCV: 79.6 fL (ref 78.0–100.0)
PLATELETS: 338 10*3/uL (ref 150–400)
RBC: 3.43 MIL/uL — AB (ref 3.87–5.11)
RDW: 21.1 % — AB (ref 11.5–15.5)
WBC: 9.4 10*3/uL (ref 4.0–10.5)

## 2015-07-20 LAB — BASIC METABOLIC PANEL
ANION GAP: 12 (ref 5–15)
BUN: 12 mg/dL (ref 6–20)
CALCIUM: 9 mg/dL (ref 8.9–10.3)
CHLORIDE: 99 mmol/L — AB (ref 101–111)
CO2: 28 mmol/L (ref 22–32)
Creatinine, Ser: 0.67 mg/dL (ref 0.44–1.00)
GFR calc non Af Amer: >60 mL/min (ref >60)
Glucose, Bld: 133 mg/dL — ABNORMAL HIGH (ref 65–99)
Potassium: 3.7 mmol/L (ref 3.5–5.1)
Sodium: 139 mmol/L (ref 135–145)

## 2015-07-20 LAB — GLUCOSE, CAPILLARY: Glucose-Capillary: 99 mg/dL (ref 65–99)

## 2015-07-20 MED ORDER — CEFAZOLIN SODIUM-DEXTROSE 2-4 GM/100ML-% IV SOLN
2.0000 g | INTRAVENOUS | Status: AC
  Administered 2015-07-21: 2 g via INTRAVENOUS
  Filled 2015-07-20: qty 100
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
## 2015-07-20 NOTE — Consult Note (Signed)
Jane Schrag EdD 

## 2015-07-21 ENCOUNTER — Encounter (HOSPITAL_COMMUNITY)
Admission: EM | Disposition: A | Discharge status: Skilled Nursing Facility | Payer: Self-pay | Source: Home / Self Care | Attending: Internal Medicine

## 2015-07-21 ENCOUNTER — Inpatient Hospital Stay (HOSPITAL_COMMUNITY): Payer: Medicaid Other | Admitting: Anesthesiology

## 2015-07-21 HISTORY — PX: I & D EXTREMITY: SHX5045

## 2015-07-21 LAB — GLUCOSE, CAPILLARY
GLUCOSE-CAPILLARY: 91 mg/dL (ref 65–99)
Glucose-Capillary: 109 mg/dL — ABNORMAL HIGH (ref 65–99)
Glucose-Capillary: 114 mg/dL — ABNORMAL HIGH (ref 65–99)

## 2015-07-21 SURGERY — IRRIGATION AND DEBRIDEMENT EXTREMITY
Anesthesia: General | Site: Leg Lower | Laterality: Bilateral

## 2015-07-21 MED ORDER — ONDANSETRON HCL 4 MG PO TABS: 4.0000 mg | ORAL_TABLET | Freq: Four times a day (QID) | ORAL | Status: DC | PRN

## 2015-07-21 MED ORDER — PROPOFOL 10 MG/ML IV BOLUS
INTRAVENOUS | Status: DC | PRN
  Administered 2015-07-21: 120 mg via INTRAVENOUS

## 2015-07-21 MED ORDER — ACETAMINOPHEN 325 MG PO TABS: 650.0000 mg | ORAL_TABLET | Freq: Four times a day (QID) | ORAL | Status: DC | PRN

## 2015-07-21 MED ORDER — SODIUM CHLORIDE 0.9 % IV SOLN: INTRAVENOUS | Status: DC

## 2015-07-21 MED ORDER — MIDAZOLAM HCL 2 MG/2ML IJ SOLN
INTRAMUSCULAR | Status: AC
  Filled 2015-07-21: qty 2

## 2015-07-21 MED ORDER — CHLORHEXIDINE GLUCONATE 4 % EX LIQD: 60.0000 mL | Freq: Once | CUTANEOUS | Status: DC

## 2015-07-21 MED ORDER — METOCLOPRAMIDE HCL 10 MG PO TABS: 5.0000 mg | ORAL_TABLET | Freq: Three times a day (TID) | ORAL | Status: DC | PRN

## 2015-07-21 MED ORDER — HYDROMORPHONE HCL 1 MG/ML IJ SOLN
INTRAMUSCULAR | Status: AC
  Filled 2015-07-21: qty 2

## 2015-07-21 MED ORDER — LIDOCAINE HCL (CARDIAC) 20 MG/ML IV SOLN
INTRAVENOUS | Status: DC | PRN
  Administered 2015-07-21: 40 mg via INTRAVENOUS

## 2015-07-21 MED ORDER — OXYCODONE HCL 5 MG PO TABS
ORAL_TABLET | ORAL | Status: AC
  Filled 2015-07-21: qty 1

## 2015-07-21 MED ORDER — OXYCODONE HCL 5 MG/5ML PO SOLN: 5.0000 mg | Freq: Once | ORAL | Status: AC | PRN

## 2015-07-21 MED ORDER — PHENYLEPHRINE HCL 10 MG/ML IJ SOLN
INTRAMUSCULAR | Status: DC | PRN
Start: 2015-07-21 — End: 2015-07-21
  Administered 2015-07-21 (×2): 240 ug via INTRAVENOUS

## 2015-07-21 MED ORDER — ONDANSETRON HCL 4 MG/2ML IJ SOLN: 4.0000 mg | Freq: Four times a day (QID) | INTRAMUSCULAR | Status: DC | PRN

## 2015-07-21 MED ORDER — METOCLOPRAMIDE HCL 5 MG/ML IJ SOLN: 5.0000 mg | Freq: Three times a day (TID) | INTRAMUSCULAR | Status: DC | PRN

## 2015-07-21 MED ORDER — SUCCINYLCHOLINE 20MG/ML (10ML) SYRINGE FOR MEDFUSION PUMP - OPTIME
INTRAMUSCULAR | Status: DC | PRN
  Administered 2015-07-21: 100 mg via INTRAVENOUS

## 2015-07-21 MED ORDER — HYDROMORPHONE HCL 1 MG/ML IJ SOLN
1.0000 mg | INTRAMUSCULAR | Status: DC | PRN
  Administered 2015-07-21 (×2): 1 mg via INTRAVENOUS

## 2015-07-21 MED ORDER — EPHEDRINE SULFATE 50 MG/ML IJ SOLN
INTRAMUSCULAR | Status: DC | PRN
  Administered 2015-07-21: 15 mg via INTRAVENOUS

## 2015-07-21 MED ORDER — FENTANYL CITRATE (PF) 250 MCG/5ML IJ SOLN
INTRAMUSCULAR | Status: AC
  Filled 2015-07-21: qty 5

## 2015-07-21 MED ORDER — 0.9 % SODIUM CHLORIDE (POUR BTL) OPTIME
TOPICAL | Status: DC | PRN
  Administered 2015-07-21: 1000 mL

## 2015-07-21 MED ORDER — MIDAZOLAM HCL 5 MG/5ML IJ SOLN
INTRAMUSCULAR | Status: DC | PRN
  Administered 2015-07-21: 2 mg via INTRAVENOUS

## 2015-07-21 MED ORDER — METHOCARBAMOL 500 MG PO TABS
500.0000 mg | ORAL_TABLET | Freq: Four times a day (QID) | ORAL | Status: DC | PRN
  Administered 2015-07-22 – 2015-07-24 (×7): 500 mg via ORAL
  Filled 2015-07-21 (×9): qty 1

## 2015-07-21 MED ORDER — OXYCODONE HCL 5 MG PO TABS
5.0000 mg | ORAL_TABLET | Freq: Once | ORAL | Status: AC | PRN
  Administered 2015-07-21: 5 mg via ORAL

## 2015-07-21 MED ORDER — PHENYLEPHRINE 40 MCG/ML (10ML) SYRINGE FOR IV PUSH (FOR BLOOD PRESSURE SUPPORT)
PREFILLED_SYRINGE | INTRAVENOUS | Status: AC
  Filled 2015-07-21: qty 10

## 2015-07-21 MED ORDER — DEXTROSE 5 % IV SOLN
500.0000 mg | Freq: Four times a day (QID) | INTRAVENOUS | Status: DC | PRN
  Filled 2015-07-21: qty 5

## 2015-07-21 MED ORDER — PROPOFOL 10 MG/ML IV BOLUS
INTRAVENOUS | Status: AC
  Filled 2015-07-21: qty 20

## 2015-07-21 MED ORDER — ACETAMINOPHEN 650 MG RE SUPP: 650.0000 mg | Freq: Four times a day (QID) | RECTAL | Status: DC | PRN

## 2015-07-21 MED ORDER — FENTANYL CITRATE (PF) 100 MCG/2ML IJ SOLN
INTRAMUSCULAR | Status: DC | PRN
  Administered 2015-07-21: 50 ug via INTRAVENOUS
  Administered 2015-07-21: 100 ug via INTRAVENOUS
  Administered 2015-07-21 (×2): 50 ug via INTRAVENOUS
  Administered 2015-07-21: 100 ug via INTRAVENOUS
  Administered 2015-07-21 (×3): 50 ug via INTRAVENOUS

## 2015-07-21 SURGICAL SUPPLY — 47 items
BLADE SURG 10 STRL SS (BLADE) ×2 IMPLANT
BLADE SURG 21 STRL SS (BLADE) ×2 IMPLANT
BNDG COHESIVE 4X5 TAN STRL (GAUZE/BANDAGES/DRESSINGS) IMPLANT
BNDG COHESIVE 6X5 TAN STRL LF (GAUZE/BANDAGES/DRESSINGS) IMPLANT
BNDG GAUZE ELAST 4 BULKY (GAUZE/BANDAGES/DRESSINGS) IMPLANT
COVER SURGICAL LIGHT HANDLE (MISCELLANEOUS) ×4 IMPLANT
DRAPE INCISE IOBAN 66X45 STRL (DRAPES) ×8 IMPLANT
DRAPE U-SHAPE 47X51 STRL (DRAPES) ×4 IMPLANT
DRSG ADAPTIC 3X8 NADH LF (GAUZE/BANDAGES/DRESSINGS) IMPLANT
DRSG MEPITEL 4X7.2 (GAUZE/BANDAGES/DRESSINGS) ×2 IMPLANT
DRSG VAC ATS LRG SENSATRAC (GAUZE/BANDAGES/DRESSINGS) ×6 IMPLANT
DURAPREP 26ML APPLICATOR (WOUND CARE) IMPLANT
ELECT CAUTERY BLADE 6.4 (BLADE) ×2 IMPLANT
ELECT REM PT RETURN 9FT ADLT (ELECTROSURGICAL)
ELECTRODE REM PT RTRN 9FT ADLT (ELECTROSURGICAL) IMPLANT
GAUZE SPONGE 4X4 12PLY STRL (GAUZE/BANDAGES/DRESSINGS) IMPLANT
GLOVE BIO SURGEON STRL SZ 6.5 (GLOVE) ×2 IMPLANT
GLOVE BIOGEL PI IND STRL 9 (GLOVE) ×1 IMPLANT
GLOVE BIOGEL PI INDICATOR 9 (GLOVE) ×1
GLOVE SURG ORTHO 9.0 STRL STRW (GLOVE) ×2 IMPLANT
GOWN STRL REUS W/ TWL LRG LVL3 (GOWN DISPOSABLE) ×1 IMPLANT
GOWN STRL REUS W/ TWL XL LVL3 (GOWN DISPOSABLE) ×2 IMPLANT
GOWN STRL REUS W/TWL LRG LVL3 (GOWN DISPOSABLE) ×1
GOWN STRL REUS W/TWL XL LVL3 (GOWN DISPOSABLE) ×2
HANDPIECE INTERPULSE COAX TIP (DISPOSABLE)
KIT BASIN OR (CUSTOM PROCEDURE TRAY) ×2 IMPLANT
KIT ROOM TURNOVER OR (KITS) ×2 IMPLANT
MANIFOLD NEPTUNE II (INSTRUMENTS) ×2 IMPLANT
NS IRRIG 1000ML POUR BTL (IV SOLUTION) ×2 IMPLANT
PACK ORTHO EXTREMITY (CUSTOM PROCEDURE TRAY) ×2 IMPLANT
PAD ARMBOARD 7.5X6 YLW CONV (MISCELLANEOUS) ×4 IMPLANT
SET HNDPC FAN SPRY TIP SCT (DISPOSABLE) IMPLANT
STAPLER VISISTAT 35W (STAPLE) ×4 IMPLANT
STOCKINETTE 6  STRL (DRAPES) ×1
STOCKINETTE 6 STRL (DRAPES) ×1 IMPLANT
STOCKINETTE IMPERVIOUS 9X36 MD (GAUZE/BANDAGES/DRESSINGS) IMPLANT
SWAB COLLECTION DEVICE MRSA (MISCELLANEOUS) IMPLANT
TISSUE THERASKIN 1X2 (Tissue) ×2 IMPLANT
TISSUE THERASKIN 2X3 (Tissue) ×6 IMPLANT
TISSUE THERASKIN 3X6 (Tissue) ×4 IMPLANT
TOWEL OR 17X24 6PK STRL BLUE (TOWEL DISPOSABLE) ×2 IMPLANT
TOWEL OR 17X26 10 PK STRL BLUE (TOWEL DISPOSABLE) ×2 IMPLANT
TUBE ANAEROBIC SPECIMEN COL (MISCELLANEOUS) IMPLANT
TUBE CONNECTING 12X1/4 (SUCTIONS) ×2 IMPLANT
UNDERPAD 30X30 INCONTINENT (UNDERPADS AND DIAPERS) ×4 IMPLANT
WATER STERILE IRR 1000ML POUR (IV SOLUTION) ×2 IMPLANT
YANKAUER SUCT BULB TIP NO VENT (SUCTIONS) ×2 IMPLANT
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
## 2015-07-21 NOTE — H&P (View-Only) (Signed)
Patient ID: Jane Richard, female   DOB: Sep 18, 1950, 65 y.o.   MRN: [id number] Patient is scheduled for surgery Friday afternoon to apply split thickness skin graft and apply portable wound VAC.  May discharge Saturday.

## 2015-07-21 NOTE — Progress Notes (Signed)
Patient remained NPO  Post midnight for skin graft today ,daugter at bedside stating concert will be sign in the OR  CHG bath given pt positive for MRSA in the nares. Prn given this shift no acute distress. this shift

## 2015-07-21 NOTE — Transfer of Care (Signed)
Immediate Anesthesia Transfer of Care Note  Patient: Jane Richard  Procedure(s) Performed: Procedure(s): IRRIGATION AND DEBRIDEMENT, SPLIT THICKNESS SKIN GRAFT, APPLY VAC BILATERAL LEGS (Bilateral)  Patient Location: PACU  Anesthesia Type:General  Level of Consciousness: awake, alert , oriented and patient cooperative  Airway & Oxygen Therapy: Patient Spontanous Breathing and Patient connected to face mask oxygen  Post-op Assessment: Report given to RN, Post -op Vital signs reviewed and stable and Patient moving all extremities X 4  Post vital signs: Reviewed and stable  Last Vitals:  Filed Vitals:   07/21/15 0608 07/21/15 1327  BP: 112/52 103/44  Pulse: 86 83  Temp: 36.8 C 36.8 C  Resp: 18 18    Last Pain:  Filed Vitals:   07/21/15 1328  PainSc: 4       Patients Stated Pain Goal: 3 ([id number])  Complications: No apparent anesthesia complications

## 2015-07-21 NOTE — Op Note (Signed)
07/11/2015 - 07/21/2015  5:17 PM  PATIENT:  Jane Richard    PRE-OPERATIVE DIAGNOSIS:  Venous Stasis Insufficiency Wounds Bilateral Legs  POST-OPERATIVE DIAGNOSIS:  Same  PROCEDURE:  IRRIGATION AND DEBRIDEMENT bilateral legs, SPLIT THICKNESS SKIN GRAFT, APPLY VAC BILATERAL LEGS  SURGEON:  DUDA,MARCUS V, MD  PHYSICIAN ASSISTANT:None ANESTHESIA:   General  PREOPERATIVE INDICATIONS:  Jane Richard is a  65 y.o. female with a diagnosis of Venous Stasis Insufficiency Wounds Bilateral Legs who failed conservative measures and elected for surgical management.    The risks benefits and alternatives were discussed with the patient preoperatively including but not limited to the risks of infection, bleeding, nerve injury, cardiopulmonary complications, the need for revision surgery, among others, and the patient was willing to proceed.  OPERATIVE IMPLANTS: TheraSkin 2 units 3 x 6 and 4 units 2 x 3  OPERATIVE FINDINGS: good granulation tissue on both wounds. Wounds are essentially circumferential of both.  OPERATIVE PROCEDURE: ppatient was brought to the operating room and underwent a general anesthetic. After adequate anesthesia obtained bilateral lower extremity wound vacs were removed.  Both legs were then prepped using Betadine paint and draped into a sterile field a timeout was called. A 21 blade knife was used to further debride the skin soft tissue muscle and fascia from both legs. This is debrided back to bleeding viable granulation tissue. The wounds were cleansed and irrigated.  The TheraSkin was then stapled in place on both legs this was covered with Mepitel dressing followed by a wound VAC. Both legs had a good suction fit with a wound VAC. Patient was extubated taken to the PACU in stable condition.  Plan to continue wound VAC therapy for 1 week. Plan for discharge to skilled nursing.  May discontinue IV antibiotics and 24 hours.

## 2015-07-21 NOTE — Progress Notes (Signed)
Pharmacy Antibiotic Note  Jane Richard is a 65 y.o. female admitted on 07/11/2015 with cellulitis.  Pharmacy consulted on 07/11/15 for Vancomycin and Zosyn dosing. Severe bilateral venous stasis ulcers on lower extremities.  She is s/p I&D with wound VAC & significant output.  Plan is for OR today 4/28 @ 17:30  for skin graft, I&D,apply VAC bilateral legs. SCr 0.67, Crcl ~80 ml/min.   Afebrile. WBC 9.4K   Plan: - Continue Vancomcyin 750mg  IV q12h- will plan to check vanc trough tomorrw @ 09:30 AM - Continue Zosyn 3.375g IV q8h - F/U plans for abx post-op  Height: 5\' 3"  (160 cm) Weight: 228 lb (103.42 kg) IBW/kg (Calculated) : 52.4  Temp (24hrs), Avg:98.1 F (36.7 C), Min:97.8 F (36.6 C), Max:98.3 F (36.8 C)   Recent Labs Lab 07/15/15 0539 07/15/15 2140 07/16/15 0657 07/19/15 0656 07/20/15 1430  WBC 5.9  --  5.6 7.7 9.4  CREATININE 0.64  --  0.58 0.62 0.67  VANCOTROUGH  --  13  --   --   --     Estimated Creatinine Clearance: 82.7 mL/min (by C-G formula based on Cr of 0.67).    Allergies  Allergen Reactions  . Sulfa Antibiotics Rash    Pt states rash, "not hives". Occurred a long time ago.    Antimicrobials this admission: Vanc 4/18>>  Zosyn 4/18>>   Dose adjustments this admission: 4/22 VT = 13 on 750 q12  Microbiology results: 4/18 BCx: NF  4/21 MRSA surgical screen: pos  Thank you for allowing pharmacy to be a part of this patient's care. Nicole Cella, RPh Clinical Pharmacist Pager: [phone] 07/21/2015 2:51 PM

## 2015-07-21 NOTE — Anesthesia Procedure Notes (Signed)
Procedure Name: Intubation Date/Time: 07/21/2015 4:31 PM Performed by: Lance Coon Pre-anesthesia Checklist: Patient identified, Timeout performed, Emergency Drugs available, Suction available and Patient being monitored Patient Re-evaluated:Patient Re-evaluated prior to inductionOxygen Delivery Method: Circle system utilized Preoxygenation: Pre-oxygenation with 100% oxygen Intubation Type: IV induction Laryngoscope Size: Miller and 2 Grade View: Grade I Tube type: Oral Tube size: 7.0 mm Number of attempts: 1 Airway Equipment and Method: Stylet Placement Confirmation: ETT inserted through vocal cords under direct vision Secured at: 21 cm Tube secured with: Tape Dental Injury: Teeth and Oropharynx as per pre-operative assessment

## 2015-07-21 NOTE — Anesthesia Preprocedure Evaluation (Signed)
Anesthesia Evaluation  Patient identified by MRN, date of birth, ID band Patient awake    Reviewed: Allergy & Precautions, NPO status , Patient's Chart, lab work & pertinent test results  Airway Mallampati: II  TM Distance: >3 FB Neck ROM: Full    Dental  (+) Teeth Intact, Dental Advisory Given   Pulmonary neg pulmonary ROS,    Pulmonary exam normal breath sounds clear to auscultation       Cardiovascular Normal cardiovascular exam+ Valvular Problems/Murmurs AS  Rhythm:Regular Rate:Normal     Neuro/Psych negative neurological ROS     GI/Hepatic Neg liver ROS, History of recent UGIB, untreated   Endo/Other  Morbid obesity  Renal/GU negative Renal ROS     Musculoskeletal  (+) Arthritis , Osteoarthritis,    Abdominal   Peds  Hematology  (+) Blood dyscrasia, anemia ,   Anesthesia Other Findings   Reproductive/Obstetrics                             Anesthesia Physical  Anesthesia Plan  ASA: III  Anesthesia Plan: General   Post-op Pain Management:    Induction: Intravenous  Airway Management Planned: Oral ETT  Additional Equipment:   Intra-op Plan:   Post-operative Plan: Extubation in OR  Informed Consent: I have reviewed the patients History and Physical, chart, labs and discussed the procedure including the risks, benefits and alternatives for the proposed anesthesia with the patient or authorized representative who has indicated his/her understanding and acceptance.   Dental advisory given  Plan Discussed with: CRNA  Anesthesia Plan Comments:         Anesthesia Quick Evaluation

## 2015-07-21 NOTE — Progress Notes (Signed)
CSW updated pt and daughter, Genia Hotter, at [facility] can accept pt be stable- they will NOT be able to accept pt over the weekend due to complexity of pt needs  Pt due for surgery today- will need to know specific wound vacs that pt is requiring so facility can order for possible admission next week  CSW will continue to follow  Domenica Reamer, [place] Worker [phone]

## 2015-07-21 NOTE — Anesthesia Postprocedure Evaluation (Signed)
Anesthesia Post Note  Patient: Jane Richard  Procedure(s) Performed: Procedure(s) (LRB): IRRIGATION AND DEBRIDEMENT, SPLIT THICKNESS SKIN GRAFT, APPLY VAC BILATERAL LEGS (Bilateral)  Patient location during evaluation: PACU Anesthesia Type: General Level of consciousness: awake and alert Pain management: pain level controlled Vital Signs Assessment: post-procedure vital signs reviewed and stable Respiratory status: spontaneous breathing, nonlabored ventilation, respiratory function stable and patient connected to nasal cannula oxygen Cardiovascular status: blood pressure returned to baseline and stable Postop Assessment: no signs of nausea or vomiting Anesthetic complications: no    Last Vitals:  Filed Vitals:   07/21/15 1327 07/21/15 1731  BP: 103/44   Pulse: 83   Temp: 36.8 C 36.4 C  Resp: 18     Last Pain:  Filed Vitals:   07/21/15 1751  PainSc: 4                  Zenaida Deed

## 2015-07-21 NOTE — Interval H&P Note (Signed)
History and Physical Interval Note:  07/21/2015 6:18 AM  Anna Genre  has presented today for surgery, with the diagnosis of Venous Stasis Insufficiency Wounds Bilateral Legs  The various methods of treatment have been discussed with the patient and family. After consideration of risks, benefits and other options for treatment, the patient has consented to  Procedure(s): IRRIGATION AND DEBRIDEMENT, SPLIT THICKNESS SKIN GRAFT, APPLY VAC BILATERAL LEGS (Bilateral) as a surgical intervention .  The patient's history has been reviewed, patient examined, no change in status, stable for surgery.  I have reviewed the patient's chart and labs.  Questions were answered to the patient's satisfaction.     DUDA,MARCUS V

## 2015-07-22 LAB — CBC
HEMATOCRIT: 24.6 % — AB (ref 36.0–46.0)
HEMOGLOBIN: 7.2 g/dL — AB (ref 12.0–15.0)
MCH: 24.1 pg — AB (ref 26.0–34.0)
MCHC: 29.3 g/dL — AB (ref 30.0–36.0)
MCV: 82.3 fL (ref 78.0–100.0)
Platelets: 238 10*3/uL (ref 150–400)
RBC: 2.99 MIL/uL — ABNORMAL LOW (ref 3.87–5.11)
RDW: 20.9 % — ABNORMAL HIGH (ref 11.5–15.5)
WBC: 6 10*3/uL (ref 4.0–10.5)

## 2015-07-22 LAB — GLUCOSE, CAPILLARY: Glucose-Capillary: 111 mg/dL — ABNORMAL HIGH (ref 65–99)

## 2015-07-22 LAB — PREPARE RBC (CROSSMATCH)

## 2015-07-22 MED ORDER — SODIUM CHLORIDE 0.9 % IV SOLN
Freq: Once | INTRAVENOUS | Status: AC
  Administered 2015-07-22: 19:00:00 via INTRAVENOUS

## 2015-07-22 NOTE — Progress Notes (Signed)
PT Cancellation Note  Patient Details Name: Jane Richard MRN: [id number] DOB: Jul 17, 1950   Cancelled Treatment:    Reason Eval/Treat Not Completed: Pain limiting ability to participate. Pt deferred OOB due to LE pain. PT to attempt as able.   Kingsley Callander 07/22/2015, 2:24 PM   Kittie Plater, PT, DPT Pager #: [phone] Office #: [phone]

## 2015-07-22 NOTE — Progress Notes (Signed)
VACs with good seal and suction Pain controlled WBAT BLEs Up with PT Dc to SNF Will plan to keep wound vac on for 1 week.  Needs f/u with Dr. Sharol Given in 1 week  N. Eduard Roux, MD [facility] [phone] 9:31 AM

## 2015-07-22 NOTE — Progress Notes (Signed)
Patient up on side of bed for approximately 25 minutes tolerated well.
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
## 2015-07-23 LAB — GLUCOSE, CAPILLARY: GLUCOSE-CAPILLARY: 94 mg/dL (ref 65–99)

## 2015-07-23 LAB — TYPE AND SCREEN
ABO/RH(D): O NEG
ANTIBODY SCREEN: NEGATIVE
Unit division: 0
Unit division: 0

## 2015-07-23 LAB — CBC
HCT: 26.7 % — ABNORMAL LOW (ref 36.0–46.0)
Hemoglobin: 8 g/dL — ABNORMAL LOW (ref 12.0–15.0)
MCH: 25.2 pg — ABNORMAL LOW (ref 26.0–34.0)
MCHC: 30 g/dL (ref 30.0–36.0)
MCV: 84 fL (ref 78.0–100.0)
PLATELETS: ADEQUATE 10*3/uL (ref 150–400)
RBC: 3.18 MIL/uL — ABNORMAL LOW (ref 3.87–5.11)
RDW: 20.3 % — AB (ref 11.5–15.5)
WBC: 6 10*3/uL (ref 4.0–10.5)

## 2015-07-23 LAB — VANCOMYCIN, TROUGH: VANCOMYCIN TR: 14 ug/mL (ref 10.0–20.0)

## 2015-07-23 LAB — CK: Total CK: 18 U/L — ABNORMAL LOW (ref 38–234)

## 2015-07-23 MED ORDER — TRAMADOL HCL 50 MG PO TABS
100.0000 mg | ORAL_TABLET | Freq: Four times a day (QID) | ORAL | Status: DC | PRN
  Administered 2015-07-23 – 2015-07-24 (×3): 100 mg via ORAL
  Filled 2015-07-23 (×3): qty 2

## 2015-07-23 MED ORDER — KETOROLAC TROMETHAMINE 30 MG/ML IJ SOLN
15.0000 mg | Freq: Four times a day (QID) | INTRAMUSCULAR | Status: DC | PRN
  Administered 2015-07-23 – 2015-07-24 (×2): 15 mg via INTRAVENOUS
  Filled 2015-07-23 (×2): qty 1

## 2015-07-23 MED ORDER — HYDROCODONE-ACETAMINOPHEN 5-325 MG PO TABS: 2.0000 | ORAL_TABLET | ORAL | Status: DC | PRN
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
## 2015-07-23 NOTE — Progress Notes (Signed)
Pharmacy Antibiotic Note  Jane Richard is a 65 y.o. female admitted on 07/11/2015 with cellulitis.  Pharmacy consulted on 07/11/15 for Vancomycin and Zosyn dosing. Severe bilateral venous stasis ulcers on lower extremities.  She is s/p I&D with wound VAC & significant output.  No evidence of underlying osteomyelitis based on imaging studies. MD noted No evidence of sepsis at this timeS/p  OR  4/28 skin grafts and placement of wound VAC bilateral legs.Plan for wound VAC x 1week.  SCr 0.67 on 4/27, Crcl ~80 ml/min.   Afebrile. WBC 6.0K  Steady state Vanc Trough = 14 mcg/ml today. Therapeutic trough, goal VT 10-15 mcg/ml  Plan: - Continue Vancomcyin 750mg  IV q12h - Continue Zosyn 3.375g IV q8h - F/U plans for abx post-op  Height: 5\' 3"  (160 cm) Weight: 228 lb (103.42 kg) IBW/kg (Calculated) : 52.4  Temp (24hrs), Avg:98.1 F (36.7 C), Min:97.3 F (36.3 C), Max:98.8 F (37.1 C)   Recent Labs Lab 07/19/15 0656 07/20/15 1430 07/22/15 1013 07/23/15 0640 07/23/15 0936  WBC 7.7 9.4 6.0 6.0  --   CREATININE 0.62 0.67  --   --   --   VANCOTROUGH  --   --   --   --  14    Estimated Creatinine Clearance: 82.7 mL/min (by C-G formula based on Cr of 0.67).    Allergies  Allergen Reactions  . Sulfa Antibiotics Rash    Pt states rash, "not hives". Occurred a long time ago.    Antimicrobials this admission: Vanc 4/18>>  Zosyn 4/18>>   Dose adjustments this admission: 4/22 VT = 13 on 750 q12h 4/30 VT = 14 on 750 q12h  Microbiology results: 4/18 BCx: NF  4/21 MRSA surgical screen: positive  Thank you for allowing pharmacy to be a part of this patient's care. Nicole Cella, RPh Clinical Pharmacist Pager: [phone] 07/23/2015 10:54 AM

## 2015-07-24 ENCOUNTER — Encounter (HOSPITAL_COMMUNITY): Payer: Self-pay | Admitting: Orthopedic Surgery

## 2015-07-24 LAB — CREATININE, SERUM
Creatinine, Ser: 0.7 mg/dL (ref 0.44–1.00)
GFR calc Af Amer: >60 mL/min (ref >60)
GFR calc non Af Amer: >60 mL/min (ref >60)

## 2015-07-24 LAB — GLUCOSE, CAPILLARY: GLUCOSE-CAPILLARY: 91 mg/dL (ref 65–99)

## 2015-07-24 MED ORDER — HYDROCORTISONE 1 % EX CREA
TOPICAL_CREAM | Freq: Three times a day (TID) | CUTANEOUS | Status: DC
Start: 2015-07-24 — End: 2015-07-25

## 2015-07-24 MED ORDER — OXYCODONE HCL 5 MG PO TABS: 5.0000 mg | ORAL_TABLET | Freq: Four times a day (QID) | ORAL | Status: DC | PRN

## 2015-07-24 MED ORDER — POLYETHYLENE GLYCOL 3350 17 G PO PACK: 17.0000 g | PACK | Freq: Every day | ORAL | Status: AC

## 2015-07-24 MED ORDER — PANTOPRAZOLE SODIUM 40 MG PO TBEC: 40.0000 mg | DELAYED_RELEASE_TABLET | Freq: Every day | ORAL | Status: AC

## 2015-07-24 MED ORDER — TRAMADOL HCL 50 MG PO TABS: 100.0000 mg | ORAL_TABLET | Freq: Four times a day (QID) | ORAL | Status: DC | PRN

## 2015-07-24 MED ORDER — PRO-STAT SUGAR FREE PO LIQD: 30.0000 mL | Freq: Two times a day (BID) | ORAL | Status: AC

## 2015-07-24 MED ORDER — METHOCARBAMOL 500 MG PO TABS: 500.0000 mg | ORAL_TABLET | Freq: Four times a day (QID) | ORAL | Status: DC | PRN
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
## 2015-07-24 NOTE — Progress Notes (Signed)
Patient ID: Jane Richard, female   DOB: 1950-08-10, 65 y.o.   MRN: [id number] Postoperative day 3 bilateral lower extremity skin grafts for massive venous stasis ulcers. The wound VAC functioning well. Anticipate patient to be discharged to skilled nursing today. We'll have the wound vacs removed today and begin daily dressing changes today.

## 2015-07-24 NOTE — Progress Notes (Signed)
Patient will discharge to [facility] Anticipated discharge date:07/24/15 Family notified: dtr Ramana at bedside Transportation by [facility]- scheduled for 3pm  CSW signing off.  Domenica Reamer, [place] Social Worker [phone]

## 2015-07-24 NOTE — Clinical Social Work Placement (Signed)
   CLINICAL SOCIAL WORK PLACEMENT  NOTE  Date:  07/24/2015  Patient Details  Name: Jane Richard MRN: [id number] Date of Birth: Jun 01, 1950  Clinical Social Work is seeking post-discharge placement for this patient at the [place] level of care (*CSW will initial, date and re-position this form in  chart as items are completed):  Yes   Patient/family provided with [place] Work Department's list of facilities offering this level of care within the geographic area requested by the patient (or if unable, by the patient's family).  Yes   Patient/family informed of their freedom to choose among providers that offer the needed level of care, that participate in Medicare, Medicaid or managed care program needed by the patient, have an available bed and are willing to accept the patient.  Yes   Patient/family informed of [facility]'s ownership interest in [facility] and [facility], as well as of the fact that they are under no obligation to receive care at these facilities.  PASRR submitted to EDS on 07/17/15     PASRR number received on 07/17/15     Existing PASRR number confirmed on       FL2 transmitted to all facilities in geographic area requested by pt/family on 07/17/15     FL2 transmitted to all facilities within larger geographic area on       Patient informed that his/her managed care company has contracts with or will negotiate with certain facilities, including the following:        Yes   Patient/family informed of bed offers received.  Patient chooses bed at [facility]     Physician recommends and patient chooses bed at      Patient to be [facility] on 07/24/15.  Patient to be transferred to facility by [facility]     Patient family notified on 07/24/15 of transfer.  Name of family member notified:  Ramana     PHYSICIAN Please sign FL2     Additional  Comment:    _______________________________________________ Cranford Mon, LCSW 07/24/2015, 2:21 PM
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
## 2015-07-25 ENCOUNTER — Encounter: Payer: Self-pay | Admitting: Internal Medicine

## 2015-07-25 ENCOUNTER — Non-Acute Institutional Stay (SKILLED_NURSING_FACILITY): Payer: Medicaid Other | Admitting: Internal Medicine

## 2015-07-25 DIAGNOSIS — D61811 Other drug-induced pancytopenia: Secondary | ICD-10-CM

## 2015-07-25 DIAGNOSIS — E46 Unspecified protein-calorie malnutrition: Secondary | ICD-10-CM

## 2015-07-25 DIAGNOSIS — I2721 Secondary pulmonary arterial hypertension: Secondary | ICD-10-CM

## 2015-07-25 DIAGNOSIS — I350 Nonrheumatic aortic (valve) stenosis: Secondary | ICD-10-CM

## 2015-07-25 DIAGNOSIS — S81809D Unspecified open wound, unspecified lower leg, subsequent encounter: Secondary | ICD-10-CM

## 2015-07-25 DIAGNOSIS — I272 Other secondary pulmonary hypertension: Secondary | ICD-10-CM

## 2015-07-25 DIAGNOSIS — R7303 Prediabetes: Secondary | ICD-10-CM | POA: Diagnosis not present

## 2015-07-25 DIAGNOSIS — R195 Other fecal abnormalities: Secondary | ICD-10-CM

## 2015-07-25 DIAGNOSIS — D509 Iron deficiency anemia, unspecified: Secondary | ICD-10-CM | POA: Diagnosis not present

## 2015-07-25 DIAGNOSIS — E8809 Other disorders of plasma-protein metabolism, not elsewhere classified: Secondary | ICD-10-CM

## 2015-07-25 NOTE — Progress Notes (Signed)
Patient ID: Jane Richard, female   DOB: 05/07/1950, 65 y.o.   MRN: [id number]    HISTORY AND PHYSICAL   DATE: 07/25/15  Location:  [facility] Room Number: 151 C Place of Service: SNF (31)   Extended Emergency Contact Information Primary Emergency Contact: Unknown,Unknown  [place] Phone: [phone] Relation: None Secondary Emergency Contact: [place] Mobile Phone: [phone] Relation: Daughter  Advanced Directive information Does patient have an advance directive?: No, Would patient like information on creating an advanced directive?: No - patient declined information FULL CODE Chief Complaint  Patient presents with  . New Admit To SNF    HPI:  65 yo female seen today as a new admission into SNF following hospital stay for aplastic anemia, acute blood loss anemia, iron deficiency anemia, b/l leg cellulitis, multiple open wounds b/l LE, prediabetes, pulmonary artery HTN, sacral pressure injury. She underwent debridement and had wound vac placed to LE 4/21st. Anemia with heme (+) stool. GI recommended no bowel prep with wound vac. 2D echo revealed moderate AS with moderate pulmonary HTN. She rec'd 3 units PRBCs. A1c 6%; hgb 8; albumin 2 at d/c. She presents to SNF for short term rehab.  She c/o frequent loose stools on daily miralax. Pain stable overall on tramadol, robaxin and roxicodone. No nursing issues. No falls.   Anemia - iron deficient. Takes iron supplemt daily  FTT/severe protein calorie malnutrition - takes nutritional supplement BID  GERD - stable on protonix daily  Multiple open wounds/sacral pressure injury - followed by wound care  Bladder sx's - takes azo daily  Past Medical History  Diagnosis Date  . Brown recluse spider bite     ~ 2015, and ~ 2007  . Arthritis   . Obesity   . Ulcers of both lower legs ([place]) 2015    extensive involvement  . Anemia 06/2015  .  Environmental allergies     mold, dust, pollen  . Aortic stenosis 07/14/2015  . Prediabetes 07/13/2015  . 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 Mild diastolic dysfunction 99991111  . 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 I&d extremity Bilateral 07/14/2015    Procedure: Debridement Bilateral Legs, Apply Infusional VAC;  Surgeon: Newt Minion, MD;  Location: [facility];  Service: Orthopedics;  Laterality: Bilateral;  . I&d extremity Bilateral 07/21/2015    Procedure: IRRIGATION AND DEBRIDEMENT, SPLIT THICKNESS SKIN GRAFT, APPLY VAC BILATERAL LEGS;  Surgeon: Newt Minion, MD;  Location: [facility];  Service: Orthopedics;  Laterality: Bilateral;    No care team member to display  Social History   Social History  . Marital Status: Divorced    Spouse Name: N/A  . Number of Children: N/A  . Years of Education: N/A   Occupational History  . Not on file.   Social History Main Topics  . Smoking status: Never Smoker   . Smokeless tobacco: Not on file  . Alcohol Use: No  . Drug Use: Not on file  . Sexual Activity: Not on file   Other Topics Concern  . Not on file   Social History Narrative     reports that she has never smoked. She does not have any smokeless tobacco history on file. She reports that she does not drink alcohol. Her drug history is not on file.  History reviewed. No pertinent family history. No family status information on file.  There is no immunization history on file for this patient.  Allergies  Allergen Reactions  . Sulfa Antibiotics Rash    Pt states rash, "not hives". Occurred a long time ago.    Medications: Patient's Medications  New Prescriptions   No medications on file  Previous Medications   AMINO ACIDS-PROTEIN HYDROLYS (FEEDING SUPPLEMENT, PRO-STAT SUGAR [place] 65,) LIQD    Take 30 mLs by mouth 2 (two) times daily.   CETIRIZINE (ZYRTEC) 10 MG TABLET    Take 10 mg by mouth daily.   FERROUS  SULFATE 325 (65 FE) MG TABLET    Take 325 mg by mouth daily with breakfast.   GARLIC 123XX123 MG CAPS    Take by mouth.   IBUPROFEN (ADVIL,MOTRIN) 200 MG TABLET    Take 200 mg by mouth every 6 (six) hours as needed.   METHOCARBAMOL (ROBAXIN) 500 MG TABLET    Take 1 tablet (500 mg total) by mouth every 6 (six) hours as needed for muscle spasms.   MULTIPLE VITAMINS-MINERALS (MULTIVITAMIN WITH MINERALS) TABLET    Take 1 tablet by mouth daily.   OXYCODONE (OXY IR/ROXICODONE) 5 MG IMMEDIATE RELEASE TABLET    Take 1-2 tablets (5-10 mg total) by mouth every 6 (six) hours as needed for breakthrough pain.   PANTOPRAZOLE (PROTONIX) 40 MG TABLET    Take 1 tablet (40 mg total) by mouth daily.   POLYETHYLENE GLYCOL (MIRALAX / GLYCOLAX) PACKET    Take 17 g by mouth daily.   PUMPKIN SEED-SOY GERM (AZO BLADDER CONTROL/GO-LESS) CAPS    Take 1 tablet by mouth daily.  Modified Medications   No medications on file  Discontinued Medications   HYDROCORTISONE CREAM 1 %    Apply topically 3 (three) times daily.   TRAMADOL (ULTRAM) 50 MG TABLET    Take 2 tablets (100 mg total) by mouth every 6 (six) hours as needed for moderate pain.    Review of Systems  Gastrointestinal: Positive for diarrhea.  Musculoskeletal: Positive for arthralgias and gait problem.  All other systems reviewed and are negative.   Filed Vitals:   07/25/15 0933  BP: 149/70  Pulse: 91  Temp: 97.5 F (36.4 C)  TempSrc: Oral  Resp: 18  Height: 5\' 3"  (1.6 m)  Weight: 288 lb (130.636 kg)  SpO2: 96%   Body mass index is 51.03 kg/(m^2).  Physical Exam  Constitutional: She is oriented to person, place, and time. She appears well-developed and well-nourished.  HENT:  Mouth/Throat: Oropharynx is clear and moist. No oropharyngeal exudate.  Eyes: Pupils are equal, round, and reactive to light. No scleral icterus.  Neck: Neck supple. Carotid bruit is present (b/l systolic murumur). No tracheal deviation present. No thyromegaly present.    Cardiovascular: Normal rate, regular rhythm and intact distal pulses.  Exam reveals no gallop and no friction rub.   Murmur (radiating into carotid b/l) heard.  Systolic murmur is present with a grade of 2/6  +1 pitting LE edema b/l. No calf TTP  Pulmonary/Chest: Effort normal and breath sounds normal. No stridor. No respiratory distress. She has no wheezes. She has no rales.  Abdominal: Soft. Bowel sounds are normal. She exhibits no distension and no mass. There is no hepatomegaly. There is no tenderness. There is no rebound and no guarding.  obese  Musculoskeletal: She exhibits edema.  Lymphadenopathy:    She has no cervical adenopathy.  Neurological: She is alert and oriented to person, place, and time. She has normal reflexes.  Skin: Skin is warm  and dry. Rash (b/l leg dsg intact. no d/c. min redness) noted.  Psychiatric: She has a normal mood and affect. Her behavior is normal. Judgment and thought content normal.     Labs reviewed: Admission on 07/11/2015, Discharged on 07/24/2015  No results displayed because visit has over 200 results.  CBC Latest Ref Rng 07/23/2015 07/22/2015 07/20/2015  WBC 4.0 - 10.5 K/uL 6.0 6.0 9.4  Hemoglobin 12.0 - 15.0 g/dL 8.0(L) 7.2(L) 8.0(L)  Hematocrit 36.0 - 46.0 % 26.7(L) 24.6(L) 27.3(L)  Platelets 150 - 400 K/uL PLATELET CLUMPS NOTED ON SMEAR, COUNT APPEARS ADEQUATE 238 338    CMP Latest Ref Rng 07/24/2015 07/20/2015 07/19/2015  Glucose 65 - 99 mg/dL - 133(H) 111(H)  BUN 6 - 20 mg/dL - 12 10  Creatinine 0.44 - 1.00 mg/dL 0.70 0.67 0.62  Sodium 135 - 145 mmol/L - 139 138  Potassium 3.5 - 5.1 mmol/L - 3.7 3.4(L)  Chloride 101 - 111 mmol/L - 99(L) 101  CO2 22 - 32 mmol/L - 28 28  Calcium 8.9 - 10.3 mg/dL - 9.0 8.7(L)    Lab Results  Component Value Date   HGBA1C 6.0* 07/12/2015   Lipid Panel  No results found for: CHOL, TRIG, HDL, CHOLHDL, VLDL, LDLCALC, LDLDIRECT      Dg Tibia/fibula Left  07/11/2015  CLINICAL DATA:  Draining soft  tissue wound. EXAM: LEFT TIBIA AND FIBULA - 2 VIEW COMPARISON:  None. FINDINGS: There appears to be a large soft tissue wound over the anterior aspect of the proximal to mid leg. No underlying bony abnormality is evident. No evidence for radiopaque soft tissue foreign body. IMPRESSION: Anterior soft tissue wound without underlying bony abnormality. Electronically Signed   By: Misty Stanley M.D.   On: 07/11/2015 18:53   Dg Tibia/fibula Right  07/11/2015  CLINICAL DATA:  Wound check. Soft tissue necrosis after brown wreck loose spider bite. Bladder wrists drainage from wound. Weakness. Sepsis. EXAM: RIGHT TIBIA AND FIBULA - 2 VIEW COMPARISON:  None. FINDINGS: Irregular soft tissue defect along the left mid to distal calf especially prominent anteromedially. There is subcutaneous edema in the calf. Severe osteoarthritis of the right knee. Vascular calcifications noted. IMPRESSION: 1. Considerable soft tissue defect/wound along the mid to distal calf, especially notable anteromedially. There is subcutaneous edema in the rest of the calf. No obvious bony destructive findings to suggest osteomyelitis of the tibia or fibula. 2. Severe osteoarthritis of the right knee. Electronically Signed   By: Van Clines M.D.   On: 07/11/2015 18:53     Assessment/Plan   ICD-9-CM ICD-10-CM   1. Loose stools 787.7 R19.5   2. Aortic stenosis 424.1 I35.0   3. Pulmonary artery hypertension (HCC) 416.8 I27.2   4. 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 Microcytic anemia 280.9 D50.9   6. Wounds, multiple open, lower extremity, unspecified laterality, subsequent encounter V58.89 S81.809D    894.0    7. Prediabetes 790.29 R73.03   8. Hypoalbuminemia due to protein-calorie malnutrition (HCC) 263.9 E46     Refer to cardio for moderate AS  Repeat CBC w diff in 1 week  Change miralax to prn constipation  Cont other  meds as ordered  Wound care as ordered  Cont nutritional supplements as  ordered  F/u with Ortho as scheduled  F/u with other specialists as ordered  GOAL: short term rehab and d/c home when medically appropriate. Communicated with pt and nursing.  Will follow  Deegan Valentino S. Eulas Post, D. O., F. A. C.  Jane Richard  [facility] [place], [place] [address] [phone] Cell (Monday-Friday 8 AM - 5 PM) [phone] After 5 PM and follow prompts

## 2015-07-27 ENCOUNTER — Encounter: Payer: Self-pay | Admitting: Adult Health

## 2015-07-27 ENCOUNTER — Non-Acute Institutional Stay (SKILLED_NURSING_FACILITY): Payer: Medicaid Other | Admitting: Adult Health

## 2015-07-27 DIAGNOSIS — S81809D Unspecified open wound, unspecified lower leg, subsequent encounter: Secondary | ICD-10-CM | POA: Diagnosis not present

## 2015-07-27 DIAGNOSIS — M79605 Pain in left leg: Secondary | ICD-10-CM

## 2015-07-27 DIAGNOSIS — M79604 Pain in right leg: Secondary | ICD-10-CM | POA: Diagnosis not present

## 2015-07-27 NOTE — Progress Notes (Signed)
Patient ID: Jane Richard, female   DOB: Jan 08, 1951, 65 y.o.   MRN: [id number]   Facility: [facility]     CODE STATUS: Full Code  Allergies  Allergen Reactions  . Sulfa Antibiotics Rash    Pt states rash, "not hives". Occurred a long time ago.    Chief Complaint  Patient presents with  . Acute Visit    HPI:  She is complaining of leg pain. She states that her current regimen is not effective for managing her pain. She does not want to ask for her pain management and would like her medication scheduled. Staff reports taht she requires a premed for her dressing change.   Past Medical History  Diagnosis Date  . Brown recluse spider bite     ~ 2015, and ~ 2007  . Arthritis   . Obesity   . Ulcers of both lower legs ([place]) 2015    extensive involvement  . Anemia 06/2015  . Environmental allergies     mold, dust, pollen  . Aortic stenosis 07/14/2015  . Prediabetes 07/13/2015  . 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 Mild diastolic dysfunction 99991111  . 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 I&d extremity Bilateral 07/14/2015    Procedure: Debridement Bilateral Legs, Apply Infusional VAC;  Surgeon: Newt Minion, MD;  Location: [facility];  Service: Orthopedics;  Laterality: Bilateral;  . I&d extremity Bilateral 07/21/2015    Procedure: IRRIGATION AND DEBRIDEMENT, SPLIT THICKNESS SKIN GRAFT, APPLY VAC BILATERAL LEGS;  Surgeon: Newt Minion, MD;  Location: [facility];  Service: Orthopedics;  Laterality: Bilateral;    Social History   Social History  . Marital Status: Divorced    Spouse Name: N/A  . Number of Children: N/A  . Years of Education: N/A   Occupational History  . Not on file.   Social History Main Topics  . Smoking status: Never Smoker   . Smokeless tobacco: Not on file  . Alcohol Use: No  . Drug Use: Not on file  . Sexual Activity: Not on file   Other Topics Concern  . Not on file   Social History Narrative    No family history on file.    VITAL SIGNS BP 120/70 mmHg  Pulse 74  Temp(Src) 97.2 F (36.2 C) (Oral)  Resp 20  Ht 5\' 3"  (1.6 m)  Wt 288 lb (130.636 kg)  BMI 51.03 kg/m2  SpO2 97%  Patient's Medications  New Prescriptions   No medications on file  Previous Medications   AMINO ACIDS-PROTEIN HYDROLYS (FEEDING SUPPLEMENT, PRO-STAT SUGAR [place] 64,) LIQD    Take 30 mLs by mouth 2 (two) times daily.   CETIRIZINE (ZYRTEC) 10 MG TABLET    Take 10 mg by mouth daily.   FERROUS SULFATE 325 (65 FE) MG TABLET    Take 325 mg by mouth daily with breakfast.   GARLIC 123XX123 MG CAPS    Take by mouth.   IBUPROFEN (ADVIL,MOTRIN) 200 MG TABLET    Take 200 mg by mouth every 6 (six) hours as needed.   METHOCARBAMOL (ROBAXIN) 500 MG TABLET    Take 1 tablet (500 mg total) by mouth every 6 (six) hours as needed for muscle spasms.   MULTIPLE VITAMINS-MINERALS (MULTIVITAMIN WITH MINERALS) TABLET    Take 1 tablet by mouth daily.   NYSTATIN CREAM (MYCOSTATIN)    Apply 1 application topically 2 (two) times daily.   OXYCODONE (OXY IR/ROXICODONE) 5 MG IMMEDIATE  RELEASE TABLET    Take 1-2 tablets (5-10 mg total) by mouth every 6 (six) hours as needed for breakthrough pain.   PANTOPRAZOLE (PROTONIX) 40 MG TABLET    Take 1 tablet (40 mg total) by mouth daily.   POLYETHYLENE GLYCOL (MIRALAX / GLYCOLAX) PACKET    Take 17 g by mouth daily.   PUMPKIN SEED-SOY GERM (AZO BLADDER CONTROL/GO-LESS) CAPS    Take 1 tablet by mouth daily.   TRAMADOL (ULTRAM) 50 MG TABLET    Take 100 mg by mouth every 6 (six) hours as needed.  Modified Medications   No medications on file  Discontinued Medications   No medications on file     SIGNIFICANT DIAGNOSTIC EXAMS  07-11-15:right tibia/fibular; 1. Considerable soft tissue defect/wound along the mid to distal calf, especially notable anteromedially. There is subcutaneous edema in the rest of the calf. No obvious bony destructive findings to suggest osteomyelitis of the tibia or  fibula. 2. Severe osteoarthritis of the right knee.   07-11-15: left tibia/fibula: Anterior soft tissue wound without underlying bony abnormality.   LABS REVIEWED:   07-11-15: vit B 12: 7141; folate 35.0; iron 7; tibc 246; albumin 2.3 07-12-15: hgb a1c 6.0 07-20-15: glucose 133; bun 12; creat 0.67; k+ 3.7; na++139  07-22-15; wbc 6.0; hgb 7.2; hgb 24.6; mcv 82.3; plt 238   Review of Systems  Constitutional: Negative for malaise/fatigue.  Respiratory: Negative for cough and shortness of breath.   Cardiovascular: Negative for chest pain, palpitations and leg swelling.  Gastrointestinal: Negative for heartburn, abdominal pain and constipation.  Musculoskeletal:       Has bilateral leg pain   Skin:       Bilateral lower extremities rash   Neurological: Negative for dizziness.  Psychiatric/Behavioral: The patient is not nervous/anxious.     Physical Exam  Constitutional: She is oriented to person, place, and time. No distress.  Eyes: Conjunctivae are normal.  Neck: Neck supple. No JVD present. No thyromegaly present.  Carotid bruit present bilateral   Cardiovascular: Normal rate, regular rhythm and intact distal pulses.   Murmur heard. Respiratory: Effort normal and breath sounds normal. No respiratory distress. She has no wheezes.  GI: Soft. Bowel sounds are normal. She exhibits no distension. There is no tenderness.  Musculoskeletal: She exhibits edema.  Able to move all extremities  1+ lower extremity edema   Lymphadenopathy:    She has no cervical adenopathy.  Neurological: She is alert and oriented to person, place, and time.  Skin: Skin is warm and dry. She is not diaphoretic.  Bilateral lower extremity dressings intact   Psychiatric: She has a normal mood and affect.      ASSESSMENT/ PLAN:  1. Bilateral leg wounds 2. Bilateral leg pain  Will change ultram 100 mg every 6 hours and will keep prn pain Will begin oxycodone 5 mg prior to dressing change  Will monitor  her status.   Ok Edwards NP [facility]  Contact [phone] Monday through Friday 8am- 5pm  After hours call [phone]

## 2015-07-31 ENCOUNTER — Other Ambulatory Visit: Payer: Self-pay | Admitting: *Deleted

## 2015-07-31 MED ORDER — OXYCODONE HCL 5 MG PO TABS: 5.0000 mg | ORAL_TABLET | Freq: Four times a day (QID) | ORAL | Status: DC | PRN

## 2015-07-31 NOTE — Telephone Encounter (Signed)
[facility] 

## 2015-08-02 LAB — CBC AND DIFFERENTIAL
HEMATOCRIT: 31 % — AB (ref 36–46)
Hemoglobin: 9.7 g/dL — AB (ref 12.0–16.0)
Neutrophils Absolute: 3776 /uL
Platelets: 334 10*3/uL (ref 150–399)
WBC: 5.9 10^3/mL

## 2015-08-07 DIAGNOSIS — M79604 Pain in right leg: Secondary | ICD-10-CM | POA: Insufficient documentation

## 2015-08-07 DIAGNOSIS — M79605 Pain in left leg: Secondary | ICD-10-CM

## 2015-08-28 ENCOUNTER — Non-Acute Institutional Stay (SKILLED_NURSING_FACILITY): Payer: Medicaid Other | Admitting: Adult Health

## 2015-08-28 ENCOUNTER — Encounter: Payer: Self-pay | Admitting: Adult Health

## 2015-08-28 DIAGNOSIS — S81809D Unspecified open wound, unspecified lower leg, subsequent encounter: Secondary | ICD-10-CM

## 2015-08-28 DIAGNOSIS — M545 Low back pain: Secondary | ICD-10-CM

## 2015-08-28 DIAGNOSIS — D61811 Other drug-induced pancytopenia: Secondary | ICD-10-CM

## 2015-08-28 DIAGNOSIS — K5901 Slow transit constipation: Secondary | ICD-10-CM

## 2015-08-28 DIAGNOSIS — K219 Gastro-esophageal reflux disease without esophagitis: Secondary | ICD-10-CM | POA: Diagnosis not present

## 2015-08-28 DIAGNOSIS — I272 Other secondary pulmonary hypertension: Secondary | ICD-10-CM | POA: Diagnosis not present

## 2015-08-28 DIAGNOSIS — J302 Other seasonal allergic rhinitis: Secondary | ICD-10-CM

## 2015-08-28 DIAGNOSIS — I2721 Secondary pulmonary arterial hypertension: Secondary | ICD-10-CM

## 2015-08-28 NOTE — Progress Notes (Signed)
Patient ID: Jane Richard, female   DOB: 29-Jun-1950, 65 y.o.   MRN: [id number]   Location:  [facility] [facility] Room Number: 151-C Place of Service:  SNF (31)   CODE STATUS: Full Code  Allergies  Allergen Reactions  . Sulfa Antibiotics Rash    Pt states rash, "not hives". Occurred a long time ago.    Chief Complaint  Patient presents with  . Medical Management of Chronic Issues    Follow up    HPI:  She is a resident of this facility being seen for the management of her chronic illnesses. She is doing well. She is participating with therapy. She does have muscle spasms in her back when she bends over. She tells me that she is doing better slowly over time. There are no nursing concerns at this time.    Past Medical History  Diagnosis Date  . Brown recluse spider bite     ~ 2015, and ~ 2007  . Arthritis   . Obesity   . Ulcers of both lower legs ([place]) 2015    extensive involvement  . Anemia 06/2015  . Environmental allergies     mold, dust, pollen  . Aortic stenosis 07/14/2015  . Prediabetes 07/13/2015  . 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 Mild diastolic dysfunction 99991111  . 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 I&d extremity Bilateral 07/14/2015    Procedure: Debridement Bilateral Legs, Apply Infusional VAC;  Surgeon: Newt Minion, MD;  Location: [facility];  Service: Orthopedics;  Laterality: Bilateral;  . I&d extremity Bilateral 07/21/2015    Procedure: IRRIGATION AND DEBRIDEMENT, SPLIT THICKNESS SKIN GRAFT, APPLY VAC BILATERAL LEGS;  Surgeon: Newt Minion, MD;  Location: [facility];  Service: Orthopedics;  Laterality: Bilateral;    Social History   Social History  . Marital Status: Divorced    Spouse Name: N/A  . Number of Children: N/A  . Years of Education: N/A   Occupational History  . Not on file.   Social History Main Topics  . Smoking status: Never Smoker   . Smokeless tobacco:  Not on file  . Alcohol Use: No  . Drug Use: Not on file  . Sexual Activity: Not on file   Other Topics Concern  . Not on file   Social History Narrative   History reviewed. No pertinent family history.    VITAL SIGNS BP 109/61 mmHg  Pulse 78  Temp(Src) 97.7 F (36.5 C) (Oral)  Resp 20  Ht 5\' 3"  (1.6 m)  Wt 245 lb (111.131 kg)  BMI 43.41 kg/m2  SpO2 96%  Patient's Medications  New Prescriptions   No medications on file  Previous Medications   AMINO ACIDS-PROTEIN HYDROLYS (FEEDING SUPPLEMENT, PRO-STAT SUGAR [place] 64,) LIQD    Take 30 mLs by mouth 2 (two) times daily.   CETIRIZINE (ZYRTEC) 10 MG TABLET    Take 10 mg by mouth daily.   FERROUS SULFATE 325 (65 FE) MG TABLET    Take 325 mg by mouth daily with breakfast.   GARLIC 123XX123 MG CAPS    Take by mouth.   IBUPROFEN (ADVIL,MOTRIN) 200 MG TABLET    Take 200 mg by mouth every 6 (six) hours as needed.   METHOCARBAMOL (ROBAXIN) 500 MG TABLET    Take 1 tablet (500 mg total) by mouth every 6 (six) hours as needed for muscle spasms.   MULTIPLE VITAMINS-MINERALS (MULTIVITAMIN WITH MINERALS) TABLET  Take 1 tablet by mouth daily.   NYSTATIN CREAM (MYCOSTATIN)    Apply 1 application topically 2 (two) times daily.   OXYCODONE (OXY IR/ROXICODONE) 5 MG IMMEDIATE RELEASE TABLET    Take 1-2 tablets (5-10 mg total) by mouth every 6 (six) hours as needed for breakthrough pain.   PANTOPRAZOLE (PROTONIX) 40 MG TABLET    Take 1 tablet (40 mg total) by mouth daily.   POLYETHYLENE GLYCOL (MIRALAX / GLYCOLAX) PACKET    Take 17 g by mouth daily.   PUMPKIN SEED-SOY GERM (AZO BLADDER CONTROL/GO-LESS) CAPS    Take 1 tablet by mouth daily.   TRAMADOL (ULTRAM) 50 MG TABLET    Take 100 mg by mouth every 6 (six) hours as needed.  Modified Medications   No medications on file  Discontinued Medications   No medications on file     SIGNIFICANT DIAGNOSTIC EXAMS  07-11-15:right tibia/fibular; 1. Considerable soft tissue defect/wound along the mid to  distal calf, especially notable anteromedially. There is subcutaneous edema in the rest of the calf. No obvious bony destructive findings to suggest osteomyelitis of the tibia or fibula. 2. Severe osteoarthritis of the right knee.   07-11-15: left tibia/fibula: Anterior soft tissue wound without underlying bony abnormality.   LABS REVIEWED:   07-11-15: vit B 12: 7141; folate 35.0; iron 7; tibc 246; albumin 2.3 07-12-15: hgb a1c 6.0 07-20-15: glucose 133; bun 12; creat 0.67; k+ 3.7; na++139  07-22-15; wbc 6.0; hgb 7.2; hgb 24.6; mcv 82.3; plt 238 08-02-15: wbc 5.9; hgb 9.7; hct 31.1; mcv 82.3 ;plt 334    Review of Systems  Constitutional: Negative for malaise/fatigue.  Respiratory: Negative for cough and shortness of breath.   Cardiovascular: Negative for chest pain, palpitations and leg swelling.  Gastrointestinal: Negative for heartburn, abdominal pain and constipation.  Musculoskeletal:       Has bilateral leg pain   Skin:       Bilateral lower extremities rash   Neurological: Negative for dizziness.  Psychiatric/Behavioral: The patient is not nervous/anxious.     Physical Exam  Constitutional: She is oriented to person, place, and time. No distress.  Eyes: Conjunctivae are normal.  Neck: Neck supple. No JVD present. No thyromegaly present.  Carotid bruit present bilateral   Cardiovascular: Normal rate, regular rhythm and intact distal pulses.   Murmur heard. Respiratory: Effort normal and breath sounds normal. No respiratory distress. She has no wheezes.  GI: Soft. Bowel sounds are normal. She exhibits no distension. There is no tenderness.  Musculoskeletal: She exhibits edema.  Able to move all extremities  1+ lower extremity edema   Lymphadenopathy:    She has no cervical adenopathy.  Neurological: She is alert and oriented to person, place, and time.  Skin: Skin is warm and dry. She is not diaphoretic.  Bilateral lower extremity dressings intact Has bilateral legs with  compression stockings    Psychiatric: She has a normal mood and affect.      ASSESSMENT/ PLAN:   1. Anemia: hgb is 9.7; will continue iron daily   2. Gerd: will continue protonix 40 mg daily   3. Constipation; will continue miralax daily   4. Allergic rhinitis: will continue zyrtec 10 mg daily   5. Pulmonary hypertension: will continue to monitor her status her b/p is 109/61  6. Bilateral lower extremity wounds: will continue treatment as directed; will continue advil 200 mg every 6 hours as needed; has ultram 100 mg every 6 hours as needed and has oxycodone 5 or  10 mg every 6 hours as needed.   7. Back pain: will change robaxin to 500 mg three times daily      Ok Edwards NP [facility]  Contact [phone] Monday through Friday 8am- 5pm  After hours call [phone]

## 2015-09-07 ENCOUNTER — Non-Acute Institutional Stay (SKILLED_NURSING_FACILITY): Payer: Medicaid Other | Admitting: Adult Health

## 2015-09-07 ENCOUNTER — Encounter: Payer: Self-pay | Admitting: Adult Health

## 2015-09-07 DIAGNOSIS — R600 Localized edema: Secondary | ICD-10-CM | POA: Diagnosis not present

## 2015-09-07 DIAGNOSIS — S81809D Unspecified open wound, unspecified lower leg, subsequent encounter: Secondary | ICD-10-CM | POA: Diagnosis not present

## 2015-09-07 DIAGNOSIS — M545 Low back pain: Secondary | ICD-10-CM

## 2015-09-07 NOTE — Progress Notes (Signed)
Patient ID: Jane Richard, female   DOB: 21-Dec-1950, 65 y.o.   MRN: [id number]    Location:  [place] Room Number: 151-C Place of Service:  SNF (31)   CODE STATUS: Full Code  Allergies  Allergen Reactions  . Sulfa Antibiotics Rash    Pt states rash, "not hives". Occurred a long time ago.    Chief Complaint  Patient presents with  . Acute Visit    Edema    HPI:  Staff is concerned about her lower extremity edema. She tells me that she has not been wearing her compression stockings as directed by the wound doctor. We had a prolonged discussion about the importance of wearing them as directed. She did verbalize understanding and states that she will start wearing them to build up her tolerance of them. She is still having back spasms with movement at this times.    Past Medical History  Diagnosis Date  . Brown recluse spider bite     ~ 2015, and ~ 2007  . Arthritis   . Obesity   . Ulcers of both lower legs ([place]) 2015    extensive involvement  . Anemia 06/2015  . Environmental allergies     mold, dust, pollen  . Aortic stenosis 07/14/2015  . Prediabetes 07/13/2015  . 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 Mild diastolic dysfunction 99991111  . 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 I&d extremity Bilateral 07/14/2015    Procedure: Debridement Bilateral Legs, Apply Infusional VAC;  Surgeon: Newt Minion, MD;  Location: [place];  Service: Orthopedics;  Laterality: Bilateral;  . I&d extremity Bilateral 07/21/2015    Procedure: IRRIGATION AND DEBRIDEMENT, SPLIT THICKNESS SKIN GRAFT, APPLY VAC BILATERAL LEGS;  Surgeon: Newt Minion, MD;  Location: [place];  Service: Orthopedics;  Laterality: Bilateral;    Social History   Social History  . Marital Status: Divorced    Spouse Name: N/A  . Number of Children: N/A  . Years of Education: N/A   Occupational History  . Not on file.   Social  History Main Topics  . Smoking status: Never Smoker   . Smokeless tobacco: Not on file  . Alcohol Use: No  . Drug Use: Not on file  . Sexual Activity: Not on file   Other Topics Concern  . Not on file   Social History Narrative   History reviewed. No pertinent family history.    VITAL SIGNS BP 117/54 mmHg  Pulse 68  Temp(Src) 97.7 F (36.5 C) (Oral)  Resp 18  Ht 5\' 3"  (1.6 m)  Wt 249 lb 4 oz (113.059 kg)  BMI 44.16 kg/m2  SpO2 96%  Patient's Medications  New Prescriptions   No medications on file  Previous Medications   AMINO ACIDS-PROTEIN HYDROLYS (FEEDING SUPPLEMENT, PRO-STAT SUGAR [address],) LIQD    Take 30 mLs by mouth 2 (two) times daily.   ASCORBIC ACID (VITAMIN C) 500 MG TABLET    Take 500 mg by mouth 2 (two) times daily.   CETIRIZINE (ZYRTEC) 10 MG TABLET    Take 10 mg by mouth daily.   FERROUS SULFATE 325 (65 FE) MG TABLET    Take 325 mg by mouth daily with breakfast.   GARLIC 123XX123 MG CAPS    Take by mouth.   IBUPROFEN (ADVIL,MOTRIN) 200 MG TABLET    Take 200 mg by mouth every 6 (six) hours as needed.   METHOCARBAMOL (  ROBAXIN) 500 MG TABLET    Take 1 tablet (500 mg total) by mouth every 6 (six) hours as needed for muscle spasms.   MULTIPLE VITAMINS-MINERALS (MULTIVITAMIN WITH MINERALS) TABLET    Take 1 tablet by mouth daily.   NAPROXEN SODIUM (ALEVE PO)    Take 2 capsules by mouth 2 (two) times daily.   NYSTATIN CREAM (MYCOSTATIN)    Apply 1 application topically 2 (two) times daily.   OXYCODONE (OXY IR/ROXICODONE) 5 MG IMMEDIATE RELEASE TABLET    Take 1-2 tablets (5-10 mg total) by mouth every 6 (six) hours as needed for breakthrough pain.   PANTOPRAZOLE (PROTONIX) 40 MG TABLET    Take 1 tablet (40 mg total) by mouth daily.   PHENAZOPYRIDINE HCL (AZO TABS PO)    Take 1 tablet by mouth daily.   POLYETHYLENE GLYCOL (MIRALAX / GLYCOLAX) PACKET    Take 17 g by mouth daily.   TRAMADOL (ULTRAM) 50 MG TABLET    Take 100 mg by mouth every 6 (six) hours as needed.   ZINC  SULFATE 220 (50 ZN) MG CAPSULE    Take 220 mg by mouth daily.  Modified Medications   No medications on file  Discontinued Medications   PUMPKIN SEED-SOY GERM (AZO BLADDER CONTROL/GO-LESS) CAPS    Take 1 tablet by mouth daily. Reported on 09/07/2015     SIGNIFICANT DIAGNOSTIC EXAMS  07-11-15:right tibia/fibular; 1. Considerable soft tissue defect/wound along the mid to distal calf, especially notable anteromedially. There is subcutaneous edema in the rest of the calf. No obvious bony destructive findings to suggest osteomyelitis of the tibia or fibula. 2. Severe osteoarthritis of the right knee.   07-11-15: left tibia/fibula: Anterior soft tissue wound without underlying bony abnormality.   LABS REVIEWED:   07-11-15: vit B 12: 7141; folate 35.0; iron 7; tibc 246; albumin 2.3 07-12-15: hgb a1c 6.0 07-20-15: glucose 133; bun 12; creat 0.67; k+ 3.7; na++139  07-22-15; wbc 6.0; hgb 7.2; hgb 24.6; mcv 82.3; plt 238 08-02-15: wbc 5.9; hgb 9.7; hct 31.1; mcv 82.3 ;plt 334    Review of Systems  Constitutional: Negative for malaise/fatigue.  Respiratory: Negative for cough and shortness of breath.   Cardiovascular: Negative for chest pain, palpitations and leg swelling.  Gastrointestinal: Negative for heartburn, abdominal pain and constipation.  Musculoskeletal:       Has bilateral leg pain   Skin:       Bilateral lower extremities wounds  Neurological: Negative for dizziness.  Psychiatric/Behavioral: The patient is not nervous/anxious.     Physical Exam  Constitutional: She is oriented to person, place, and time. No distress.  Eyes: Conjunctivae are normal.  Neck: Neck supple. No JVD present. No thyromegaly present.  Carotid bruit present bilateral   Cardiovascular: Normal rate, regular rhythm and intact distal pulses.   Murmur heard. Respiratory: Effort normal and breath sounds normal. No respiratory distress. She has no wheezes.  GI: Soft. Bowel sounds are normal. She exhibits no  distension. There is no tenderness.  Musculoskeletal: She exhibits edema.  Able to move all extremities  1-2+ lower extremity edema   Lymphadenopathy:    She has no cervical adenopathy.  Neurological: She is alert and oriented to person, place, and time.  Skin: Skin is warm and dry. She is not diaphoretic.  Bilateral lower extremity dressings intact   Psychiatric: She has a normal mood and affect.      ASSESSMENT/ PLAN:  1. Bilateral lower extremity edema 2. Low back pain 3. Wounds to bilateral  lower extremities:   Will have her wear her compression during the day as much she can tolerate Will increase her robaxin to 750 mg three times daily     Ok Edwards NP [facility]  Contact [phone] Monday through Friday 8am- 5pm  After hours call [phone]

## 2015-09-08 DIAGNOSIS — M545 Low back pain, unspecified: Secondary | ICD-10-CM | POA: Insufficient documentation

## 2015-09-08 DIAGNOSIS — R600 Localized edema: Secondary | ICD-10-CM | POA: Insufficient documentation

## 2015-09-08 DIAGNOSIS — K5901 Slow transit constipation: Secondary | ICD-10-CM | POA: Insufficient documentation

## 2015-09-08 DIAGNOSIS — K219 Gastro-esophageal reflux disease without esophagitis: Secondary | ICD-10-CM | POA: Insufficient documentation

## 2015-09-08 DIAGNOSIS — J302 Other seasonal allergic rhinitis: Secondary | ICD-10-CM | POA: Insufficient documentation

## 2015-09-08 MED ORDER — METHOCARBAMOL 750 MG PO TABS: 750.0000 mg | ORAL_TABLET | Freq: Three times a day (TID) | ORAL | Status: AC

## 2015-09-11 ENCOUNTER — Other Ambulatory Visit: Payer: Self-pay | Admitting: *Deleted

## 2015-09-11 MED ORDER — OXYCODONE HCL 5 MG PO TABS: ORAL_TABLET | ORAL | Status: AC

## 2015-09-11 NOTE — Telephone Encounter (Signed)
[facility] 

## 2015-10-02 ENCOUNTER — Encounter: Payer: Self-pay | Admitting: Adult Health

## 2015-10-02 ENCOUNTER — Non-Acute Institutional Stay (SKILLED_NURSING_FACILITY): Payer: Medicaid Other | Admitting: Adult Health

## 2015-10-02 DIAGNOSIS — K5901 Slow transit constipation: Secondary | ICD-10-CM

## 2015-10-02 DIAGNOSIS — J302 Other seasonal allergic rhinitis: Secondary | ICD-10-CM

## 2015-10-02 DIAGNOSIS — I272 Other secondary pulmonary hypertension: Secondary | ICD-10-CM | POA: Diagnosis not present

## 2015-10-02 DIAGNOSIS — K219 Gastro-esophageal reflux disease without esophagitis: Secondary | ICD-10-CM

## 2015-10-02 DIAGNOSIS — M545 Low back pain: Secondary | ICD-10-CM | POA: Diagnosis not present

## 2015-10-02 DIAGNOSIS — I2721 Secondary pulmonary arterial hypertension: Secondary | ICD-10-CM

## 2015-10-02 DIAGNOSIS — D61811 Other drug-induced pancytopenia: Secondary | ICD-10-CM

## 2015-10-02 DIAGNOSIS — S81809D Unspecified open wound, unspecified lower leg, subsequent encounter: Secondary | ICD-10-CM | POA: Diagnosis not present

## 2015-10-02 NOTE — Progress Notes (Signed)
Patient ID: Jane Richard, female   DOB: July 15, 1950, 65 y.o.   MRN: [id number]     Location:  [place] Room Number: 151-C Place of Service:  SNF (31)   CODE STATUS: Full Code  Allergies  Allergen Reactions  . Sulfa Antibiotics Rash    Pt states rash, "not hives". Occurred a long time ago.    Chief Complaint  Patient presents with  . Medical Management of Chronic Issues    Follow up    HPI:  She is a resident of this facility being seen for the management of her chronic illnesses. Overall her status is stable. She is not voicing any complaints or concerns today stating that she is feeing good. There are no nursing concerns at this time.    Past Medical History  Diagnosis Date  . Brown recluse spider bite     ~ 2015, and ~ 2007  . Arthritis   . Obesity   . Ulcers of both lower legs ([place]) 2015    extensive involvement  . Anemia 06/2015  . Environmental allergies     mold, dust, pollen  . Aortic stenosis 07/14/2015  . Prediabetes 07/13/2015  . 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 Mild diastolic dysfunction 99991111  . 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 I&d extremity Bilateral 07/14/2015    Procedure: Debridement Bilateral Legs, Apply Infusional VAC;  Surgeon: Newt Minion, MD;  Location: [place];  Service: Orthopedics;  Laterality: Bilateral;  . I&d extremity Bilateral 07/21/2015    Procedure: IRRIGATION AND DEBRIDEMENT, SPLIT THICKNESS SKIN GRAFT, APPLY VAC BILATERAL LEGS;  Surgeon: Newt Minion, MD;  Location: [place];  Service: Orthopedics;  Laterality: Bilateral;    Social History   Social History  . Marital Status: Divorced    Spouse Name: N/A  . Number of Children: N/A  . Years of Education: N/A   Occupational History  . Not on file.   Social History Main Topics  . Smoking status: Never Smoker   . Smokeless tobacco: Not on file  . Alcohol Use: No  . Drug Use: Not on file  . Sexual  Activity: Not on file   Other Topics Concern  . Not on file   Social History Narrative   History reviewed. No pertinent family history.    VITAL SIGNS BP 119/66 mmHg  Pulse 77  Temp(Src) 97.7 F (36.5 C) (Oral)  Resp 18  Ht 5\' 3"  (1.6 m)  Wt 243 lb 4 oz (110.337 kg)  BMI 43.10 kg/m2  SpO2 96%  Patient's Medications  New Prescriptions   No medications on file  Previous Medications   AMINO ACIDS-PROTEIN HYDROLYS (FEEDING SUPPLEMENT, PRO-STAT SUGAR [address],) LIQD    Take 30 mLs by mouth 2 (two) times daily.   CETIRIZINE (ZYRTEC) 10 MG TABLET    Take 10 mg by mouth daily.   FERROUS SULFATE 325 (65 FE) MG TABLET    Take 325 mg by mouth daily with breakfast.   GARLIC 123XX123 MG CAPS    Take by mouth.   IBUPROFEN (ADVIL,MOTRIN) 200 MG TABLET    Take 200 mg by mouth every 6 (six) hours as needed.   METHOCARBAMOL (ROBAXIN-750) 750 MG TABLET    Take 1 tablet (750 mg total) by mouth 3 (three) times daily.   MULTIPLE VITAMINS-MINERALS (MULTIVITAMIN WITH MINERALS) TABLET    Take 1 tablet by mouth daily.   NYSTATIN CREAM (MYCOSTATIN)  Apply 1 application topically daily.    OXYCODONE (OXY IR/ROXICODONE) 5 MG IMMEDIATE RELEASE TABLET    Take one to two tablets by mouth every 6 hours as needed for pain. Given 30 minutes prior to wound care   PANTOPRAZOLE (PROTONIX) 40 MG TABLET    Take 1 tablet (40 mg total) by mouth daily.   PHENAZOPYRIDINE HCL (AZO TABS PO)    Take 1 tablet by mouth daily.   POLYETHYLENE GLYCOL (MIRALAX / GLYCOLAX) PACKET    Take 17 g by mouth daily.   TRAMADOL (ULTRAM) 50 MG TABLET    Take 100 mg by mouth every 6 (six) hours as needed.  Modified Medications   No medications on file  Discontinued Medications   ASCORBIC ACID (VITAMIN C) 500 MG TABLET    Take 500 mg by mouth 2 (two) times daily. Reported on 10/02/2015   NAPROXEN SODIUM (ALEVE PO)    Take 2 capsules by mouth 2 (two) times daily. Reported on 10/02/2015   ZINC SULFATE 220 (50 ZN) MG CAPSULE    Take 220 mg by  mouth daily. Reported on 10/02/2015     SIGNIFICANT DIAGNOSTIC EXAMS  07-11-15:right tibia/fibular; 1. Considerable soft tissue defect/wound along the mid to distal calf, especially notable anteromedially. There is subcutaneous edema in the rest of the calf. No obvious bony destructive findings to suggest osteomyelitis of the tibia or fibula. 2. Severe osteoarthritis of the right knee.   07-11-15: left tibia/fibula: Anterior soft tissue wound without underlying bony abnormality.   LABS REVIEWED:   07-11-15: vit B 12: 7141; folate 35.0; iron 7; tibc 246; albumin 2.3 07-12-15: hgb a1c 6.0 07-20-15: glucose 133; bun 12; creat 0.67; k+ 3.7; na++139  07-22-15; wbc 6.0; hgb 7.2; hgb 24.6; mcv 82.3; plt 238 08-02-15: wbc 5.9; hgb 9.7; hct 31.1; mcv 82.3 ;plt 334    Review of Systems  Constitutional: Negative for malaise/fatigue.  Respiratory: Negative for cough and shortness of breath.   Cardiovascular: Negative for chest pain, palpitations and leg swelling.  Gastrointestinal: Negative for heartburn, abdominal pain and constipation.  Musculoskeletal:       Has bilateral leg pain is controlled    Skin:       Bilateral lower extremities wounds  Neurological: Negative for dizziness.  Psychiatric/Behavioral: The patient is not nervous/anxious.     Physical Exam  Constitutional: She is oriented to person, place, and time. No distress.  Eyes: Conjunctivae are normal.  Neck: Neck supple. No JVD present. No thyromegaly present.  Carotid bruit present bilateral   Cardiovascular: Normal rate, regular rhythm and intact distal pulses.   Murmur heard. Respiratory: Effort normal and breath sounds normal. No respiratory distress. She has no wheezes.  GI: Soft. Bowel sounds are normal. She exhibits no distension. There is no tenderness.  Musculoskeletal: She exhibits edema.  Able to move all extremities  1-2+ lower extremity edema   Lymphadenopathy:    She has no cervical adenopathy.  Neurological:  She is alert and oriented to person, place, and time.  Skin: Skin is warm and dry. She is not diaphoretic.  Bilateral lower extremity dressings intact   Psychiatric: She has a normal mood and affect.      ASSESSMENT/ PLAN:  1. Anemia: hgb is 9.7; will continue iron daily   2. Gerd: will continue protonix 40 mg daily   3. Constipation; will continue miralax daily   4. Allergic rhinitis: will continue zyrtec 10 mg daily   5. Pulmonary hypertension: will continue to monitor her  status her b/p is 119/66  6. Bilateral lower extremity wounds: will continue treatment as directed; will continue advil 200 mg every 6 hours as needed; has ultram 100 mg every 6 hours as needed and has oxycodone 5 or 10 mg every 6 hours as needed.   7. Back pain: will continue robaxin  750 mg three times daily     Ok Edwards NP [facility]  Contact [phone] Monday through Friday 8am- 5pm  After hours call [phone]

## 2015-10-06 ENCOUNTER — Encounter: Payer: Self-pay | Admitting: Adult Health

## 2015-10-06 ENCOUNTER — Non-Acute Institutional Stay (SKILLED_NURSING_FACILITY): Payer: Medicaid Other | Admitting: Adult Health

## 2015-10-06 DIAGNOSIS — Z6841 Body Mass Index (BMI) 40.0 and over, adult: Secondary | ICD-10-CM

## 2015-10-06 DIAGNOSIS — L03116 Cellulitis of left lower limb: Secondary | ICD-10-CM

## 2015-10-06 DIAGNOSIS — E46 Unspecified protein-calorie malnutrition: Secondary | ICD-10-CM

## 2015-10-06 DIAGNOSIS — D509 Iron deficiency anemia, unspecified: Secondary | ICD-10-CM | POA: Diagnosis not present

## 2015-10-06 DIAGNOSIS — E8809 Other disorders of plasma-protein metabolism, not elsewhere classified: Secondary | ICD-10-CM

## 2015-10-06 DIAGNOSIS — I2721 Secondary pulmonary arterial hypertension: Secondary | ICD-10-CM

## 2015-10-06 DIAGNOSIS — I272 Other secondary pulmonary hypertension: Secondary | ICD-10-CM | POA: Diagnosis not present

## 2015-10-06 NOTE — Progress Notes (Signed)
Patient ID: Jane Richard, female   DOB: [birth year], 65 y.o.   MRN: [id number]   Location:   [place] Room Number: 151-C Place of Service:  SNF (31)    CODE STATUS: Full Code  Allergies  Allergen Reactions  . Sulfa Antibiotics Rash    Pt states rash, "not hives". Occurred a long time ago.    Chief Complaint  Patient presents with  . Discharge Note    Discharge from facility    HPI:  She is being discharged to home. She will be moving to [place] in order to be closer to family. She will need home health for RN. She will need a bariatric rollator. She will need her prescriptions to be written and will need to follow up with her medical provider.    Past Medical History  Diagnosis Date  . Brown recluse spider bite     ~ 2015, and ~ 2007  . Arthritis   . Obesity   . Ulcers of both lower legs ([place]) 2015    extensive involvement  . Anemia 06/2015  . Environmental allergies     mold, dust, pollen  . Aortic stenosis 07/14/2015  . Prediabetes 07/13/2015  . 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 Mild diastolic dysfunction 99991111  . 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 I&d extremity Bilateral 07/14/2015    Procedure: Debridement Bilateral Legs, Apply Infusional VAC;  Surgeon: Newt Minion, MD;  Location: [place];  Service: Orthopedics;  Laterality: Bilateral;  . I&d extremity Bilateral 07/21/2015    Procedure: IRRIGATION AND DEBRIDEMENT, SPLIT THICKNESS SKIN GRAFT, APPLY VAC BILATERAL LEGS;  Surgeon: Newt Minion, MD;  Location: [place];  Service: Orthopedics;  Laterality: Bilateral;    Social History   Social History  . Marital Status: Divorced    Spouse Name: N/A  . Number of Children: N/A  . Years of Education: N/A   Occupational History  . Not on file.   Social History Main Topics  . Smoking status: Never Smoker   . Smokeless tobacco: Not on file  . Alcohol Use: No  . Drug Use: Not on file  . Sexual  Activity: Not on file   Other Topics Concern  . Not on file   Social History Narrative   History reviewed. No pertinent family history.  VITAL SIGNS BP 149/73 mmHg  Pulse 99  Temp(Src) 97.5 F (36.4 C) (Oral)  Resp 19  Ht 5\' 3"  (1.6 m)  Wt 243 lb 4 oz (110.337 kg)  BMI 43.10 kg/m2  SpO2 96%  Patient's Medications  New Prescriptions   No medications on file  Previous Medications   AMINO ACIDS-PROTEIN HYDROLYS (FEEDING SUPPLEMENT, PRO-STAT SUGAR [place],) LIQD    Take 30 mLs by mouth 2 (two) times daily.   CETIRIZINE (ZYRTEC) 10 MG TABLET    Take 10 mg by mouth daily.   FERROUS SULFATE 325 (65 FE) MG TABLET    Take 325 mg by mouth daily with breakfast.   GARLIC 123XX123 MG CAPS    Take by mouth.   IBUPROFEN (ADVIL,MOTRIN) 200 MG TABLET    Take 200 mg by mouth every 6 (six) hours as needed.   METHOCARBAMOL (ROBAXIN-750) 750 MG TABLET    Take 1 tablet (750 mg total) by mouth 3 (three) times daily.   MULTIPLE VITAMINS-MINERALS (MULTIVITAMIN WITH MINERALS) TABLET    Take 1 tablet by mouth daily.   NYSTATIN CREAM (MYCOSTATIN)  Apply 1 application topically daily.    OXYCODONE (OXY IR/ROXICODONE) 5 MG IMMEDIATE RELEASE TABLET    Take one to two tablets by mouth every 6 hours as needed for pain. Given 30 minutes prior to wound care   PANTOPRAZOLE (PROTONIX) 40 MG TABLET    Take 1 tablet (40 mg total) by mouth daily.   PHENAZOPYRIDINE HCL (AZO TABS PO)    Take 1 tablet by mouth daily.   POLYETHYLENE GLYCOL (MIRALAX / GLYCOLAX) PACKET    Take 17 g by mouth daily.   TRAMADOL (ULTRAM) 50 MG TABLET    Take 100 mg by mouth every 6 (six) hours as needed.  Modified Medications   No medications on file  Discontinued Medications   No medications on file     SIGNIFICANT DIAGNOSTIC EXAMS  07-11-15:right tibia/fibular; 1. Considerable soft tissue defect/wound along the mid to distal calf, especially notable anteromedially. There is subcutaneous edema in the rest of the calf. No obvious bony  destructive findings to suggest osteomyelitis of the tibia or fibula. 2. Severe osteoarthritis of the right knee.   07-11-15: left tibia/fibula: Anterior soft tissue wound without underlying bony abnormality.   LABS REVIEWED:   07-11-15: vit B 12: 7141; folate 35.0; iron 7; tibc 246; albumin 2.3 07-12-15: hgb a1c 6.0 07-20-15: glucose 133; bun 12; creat 0.67; k+ 3.7; na++139  07-22-15; wbc 6.0; hgb 7.2; hgb 24.6; mcv 82.3; plt 238 08-02-15: wbc 5.9; hgb 9.7; hct 31.1; mcv 82.3 ;plt 334    Review of Systems  Constitutional: Negative for malaise/fatigue.  Respiratory: Negative for cough and shortness of breath.   Cardiovascular: Negative for chest pain, palpitations and leg swelling.  Gastrointestinal: Negative for heartburn, abdominal pain and constipation.  Musculoskeletal:       Has bilateral leg pain is controlled    Skin:       Bilateral lower extremities wounds  Neurological: Negative for dizziness.  Psychiatric/Behavioral: The patient is not nervous/anxious.     Physical Exam  Constitutional: She is oriented to person, place, and time. No distress.  Eyes: Conjunctivae are normal.  Neck: Neck supple. No JVD present. No thyromegaly present.  Carotid bruit present bilateral   Cardiovascular: Normal rate, regular rhythm and intact distal pulses.   Murmur heard. Respiratory: Effort normal and breath sounds normal. No respiratory distress. She has no wheezes.  GI: Soft. Bowel sounds are normal. She exhibits no distension. There is no tenderness.  Musculoskeletal: She exhibits edema.  Able to move all extremities  1-2+ lower extremity edema   Lymphadenopathy:    She has no cervical adenopathy.  Neurological: She is alert and oriented to person, place, and time.  Skin: Skin is warm and dry. She is not diaphoretic.  Bilateral lower extremity dressings intact   Psychiatric: She has a normal mood and affect.      ASSESSMENT/ PLAN:   Patient is being discharged with the  following home health services:  RN; to evaluate and treat as indicated for medication management   Patient is being discharged with the following durable medical equipment:  Will need a bariatric rollator in order for her to maintain her current level of independence with adl's.   Patient has been advised to f/u with their PCP in 1-2 weeks to bring them up to date on their rehab stay.  Social services at facility was responsible for arranging this appointment.  Pt was provided with a 30 day supply of prescriptions for medications and refills must be obtained from their  PCP.  For controlled substances, a more limited supply may be provided adequate until PCP appointment only.#20 oxycodone 5 mg tabs; #60 ultram 50 mg tabs.    Time spent with patient 40   minutes >50% time spent counseling; reviewing medical record; tests; labs; and developing future plan of care    Ok Edwards NP [facility]  Contact [phone] Monday through Friday 8am- 5pm  After hours call [phone]
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
